# Patient Record
Sex: Female | Born: 1990 | Race: White | Hispanic: No | Marital: Single | Smoking: Current every day smoker
Health system: Southern US, Community
[De-identification: ages and names within clinical notes are randomized; demographics above are authoritative.]

## PROBLEM LIST (undated history)

## (undated) DIAGNOSIS — F329 Major depressive disorder, single episode, unspecified: Secondary | ICD-10-CM

## (undated) DIAGNOSIS — F32A Depression, unspecified: Secondary | ICD-10-CM

## (undated) DIAGNOSIS — F419 Anxiety disorder, unspecified: Secondary | ICD-10-CM

## (undated) DIAGNOSIS — T7840XA Allergy, unspecified, initial encounter: Secondary | ICD-10-CM

## (undated) DIAGNOSIS — B009 Herpesviral infection, unspecified: Secondary | ICD-10-CM

## (undated) DIAGNOSIS — M549 Dorsalgia, unspecified: Secondary | ICD-10-CM

## (undated) DIAGNOSIS — F319 Bipolar disorder, unspecified: Secondary | ICD-10-CM

## (undated) HISTORY — DX: Allergy, unspecified, initial encounter: T78.40XA

## (undated) HISTORY — DX: Major depressive disorder, single episode, unspecified: F32.9

## (undated) HISTORY — DX: Depression, unspecified: F32.A

## (undated) HISTORY — PX: TONSILLECTOMY: SUR1361

---

## 2013-04-16 LAB — HM PAP SMEAR: HM Pap smear: NORMAL

## 2013-04-16 LAB — LIPID PANEL
CHOLESTEROL: 177 mg/dL (ref 0–200)
HDL: 53 mg/dL (ref 35–70)
LDL CALC: 94 mg/dL
TRIGLYCERIDES: 150 mg/dL (ref 40–160)

## 2013-12-16 ENCOUNTER — Ambulatory Visit: Payer: Self-pay | Admitting: Family Medicine

## 2014-06-12 DIAGNOSIS — F419 Anxiety disorder, unspecified: Secondary | ICD-10-CM | POA: Insufficient documentation

## 2014-06-12 DIAGNOSIS — G8921 Chronic pain due to trauma: Secondary | ICD-10-CM | POA: Insufficient documentation

## 2014-11-13 LAB — CBC AND DIFFERENTIAL: Hemoglobin: 13 g/dL (ref 12.0–16.0)

## 2014-11-18 ENCOUNTER — Ambulatory Visit: Payer: Self-pay | Admitting: Internal Medicine

## 2015-01-20 ENCOUNTER — Ambulatory Visit: Payer: Self-pay | Admitting: Internal Medicine

## 2015-10-07 ENCOUNTER — Encounter: Payer: Self-pay | Admitting: Internal Medicine

## 2015-10-07 ENCOUNTER — Other Ambulatory Visit: Payer: Self-pay | Admitting: Internal Medicine

## 2015-10-07 DIAGNOSIS — R32 Unspecified urinary incontinence: Secondary | ICD-10-CM | POA: Insufficient documentation

## 2015-10-07 DIAGNOSIS — F324 Major depressive disorder, single episode, in partial remission: Secondary | ICD-10-CM | POA: Insufficient documentation

## 2015-10-07 DIAGNOSIS — Z8742 Personal history of other diseases of the female genital tract: Secondary | ICD-10-CM | POA: Insufficient documentation

## 2015-10-07 DIAGNOSIS — K120 Recurrent oral aphthae: Secondary | ICD-10-CM | POA: Insufficient documentation

## 2016-09-11 DIAGNOSIS — F19950 Other psychoactive substance use, unspecified with psychoactive substance-induced psychotic disorder with delusions: Secondary | ICD-10-CM | POA: Insufficient documentation

## 2016-09-12 DIAGNOSIS — F1120 Opioid dependence, uncomplicated: Secondary | ICD-10-CM | POA: Insufficient documentation

## 2016-09-12 DIAGNOSIS — F1420 Cocaine dependence, uncomplicated: Secondary | ICD-10-CM | POA: Insufficient documentation

## 2016-09-12 DIAGNOSIS — Z8659 Personal history of other mental and behavioral disorders: Secondary | ICD-10-CM | POA: Insufficient documentation

## 2016-09-12 DIAGNOSIS — F1220 Cannabis dependence, uncomplicated: Secondary | ICD-10-CM | POA: Insufficient documentation

## 2016-09-21 DIAGNOSIS — F1520 Other stimulant dependence, uncomplicated: Secondary | ICD-10-CM | POA: Insufficient documentation

## 2016-09-25 ENCOUNTER — Ambulatory Visit (INDEPENDENT_AMBULATORY_CARE_PROVIDER_SITE_OTHER): Payer: Self-pay

## 2016-09-25 ENCOUNTER — Encounter: Payer: Self-pay | Admitting: Internal Medicine

## 2016-09-25 VITALS — BP 148/86 | HR 88 | Resp 16 | Ht 67.0 in | Wt 228.4 lb

## 2016-09-25 DIAGNOSIS — S32009A Unspecified fracture of unspecified lumbar vertebra, initial encounter for closed fracture: Secondary | ICD-10-CM | POA: Insufficient documentation

## 2016-09-25 DIAGNOSIS — M62830 Muscle spasm of back: Secondary | ICD-10-CM

## 2016-09-25 DIAGNOSIS — S20219A Contusion of unspecified front wall of thorax, initial encounter: Secondary | ICD-10-CM

## 2016-09-25 MED ORDER — CYCLOBENZAPRINE HCL 10 MG PO TABS: 10.0000 mg | ORAL_TABLET | Freq: Three times a day (TID) | ORAL | 0 refills | Status: DC | PRN

## 2016-09-25 MED ORDER — IBUPROFEN 800 MG PO TABS: 800.0000 mg | ORAL_TABLET | Freq: Three times a day (TID) | ORAL | 0 refills | Status: DC | PRN
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
## 2016-09-25 NOTE — Patient Instructions (Addendum)
Ibuprofen 2400 mg/day - maximum 12 of the 200 mg per day ----800 mg three times a day  Acetaminophen 4000 mg/day maximum -----1000 mg 4 times a day  Heat 20 minutes three times a day  Stretch after the heat

## 2016-10-24 ENCOUNTER — Other Ambulatory Visit: Payer: Self-pay | Admitting: Internal Medicine

## 2016-10-24 DIAGNOSIS — S20219A Contusion of unspecified front wall of thorax, initial encounter: Secondary | ICD-10-CM

## 2016-10-24 DIAGNOSIS — M62830 Muscle spasm of back: Secondary | ICD-10-CM

## 2016-10-24 MED ORDER — IBUPROFEN 800 MG PO TABS: 800.0000 mg | ORAL_TABLET | Freq: Three times a day (TID) | ORAL | 2 refills | Status: DC | PRN

## 2016-10-24 MED ORDER — CYCLOBENZAPRINE HCL 10 MG PO TABS: 10.0000 mg | ORAL_TABLET | Freq: Three times a day (TID) | ORAL | 2 refills | Status: DC | PRN

## 2016-12-28 ENCOUNTER — Encounter: Payer: Self-pay | Admitting: *Deleted

## 2016-12-28 ENCOUNTER — Ambulatory Visit
Admission: EM | Admit: 2016-12-28 | Discharge: 2016-12-28 | Disposition: A | Discharge status: Home / Self Care | Payer: BLUE CROSS/BLUE SHIELD | Attending: Family Medicine | Admitting: Family Medicine

## 2016-12-28 DIAGNOSIS — T148XXA Other injury of unspecified body region, initial encounter: Secondary | ICD-10-CM | POA: Diagnosis not present

## 2016-12-28 DIAGNOSIS — L089 Local infection of the skin and subcutaneous tissue, unspecified: Secondary | ICD-10-CM

## 2016-12-28 DIAGNOSIS — L03116 Cellulitis of left lower limb: Secondary | ICD-10-CM | POA: Diagnosis not present

## 2016-12-28 HISTORY — DX: Anxiety disorder, unspecified: F41.9

## 2016-12-28 HISTORY — DX: Bipolar disorder, unspecified: F31.9

## 2016-12-28 MED ORDER — CEFUROXIME AXETIL 500 MG PO TABS: 500.0000 mg | ORAL_TABLET | Freq: Two times a day (BID) | ORAL | 0 refills | Status: DC

## 2016-12-28 MED ORDER — MUPIROCIN 2 % EX OINT: 1.0000 "application " | TOPICAL_OINTMENT | Freq: Three times a day (TID) | CUTANEOUS | 0 refills | Status: DC

## 2016-12-28 MED ORDER — HYDROCODONE-ACETAMINOPHEN 5-325 MG PO TABS: 2.0000 | ORAL_TABLET | ORAL | 0 refills | Status: DC | PRN

## 2016-12-28 MED ORDER — MELOXICAM 15 MG PO TABS: 15.0000 mg | ORAL_TABLET | Freq: Every day | ORAL | 0 refills | Status: DC

## 2016-12-28 NOTE — ED Triage Notes (Signed)
Patient fell and lacerated her left thigh / hip. [facility]. The laceration is irritated and discoloration from bruising is present. Swelling is also visible. Patient reports cleaning the wound twice daily.

## 2016-12-28 NOTE — ED Provider Notes (Signed)
[facility]    CSN: [id number] Arrival date & time: 12/28/16  1551     History   Chief Complaint Chief Complaint  Patient presents with  . Wound Check    HPI Alyssa Allen is a 26 y.o. female.   Patient is 26 year old white female who states that she fell down 5 flights of stairs. She is a laceration to her left upper thigh. She reports getting sun at [facility] emergency room. She states no x-rays done of her leg since then she's had increasing pain in her left leg and drainage coming from the wound. She reports marked bruising of the left upper thigh as well on the lateral aspect. No nausea no vomiting but states that she's been on Bactroban ointment on the wound but has not been doing anything for the wound. Past smoking history no known drug allergies she has bipolar disease depression and anxiety. She's had a closed fracture of transverse lumbar vessel.. Patient requested something for pain and for the infection   The history is provided by the patient. No language interpreter was used.  Wound Check  This is a new problem. The current episode started more than 2 days ago. The problem occurs constantly. The problem has been gradually worsening. Pertinent negatives include no chest pain, no abdominal pain, no headaches and no shortness of breath. Nothing aggravates the symptoms. Nothing relieves the symptoms. She has tried nothing for the symptoms.    Past Medical History:  Diagnosis Date  . Allergy   . Anxiety   . Bipolar 1 disorder (HCC)   . Depression     Patient Active Problem List   Diagnosis Date Noted  . Closed fracture of transverse process of lumbar vertebra (HCC) 09/25/2016  . Adderall use disorder, severe (HCC) 09/21/2016  . Cannabis use disorder, severe, dependence (HCC) 09/12/2016  . Cocaine use disorder, severe, dependence (HCC) 09/12/2016  . History of ADHD 09/12/2016  . History of bipolar disorder 09/12/2016  . Opioid use disorder,  moderate, dependence (HCC) 09/12/2016  . 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 H/O abnormal cervical Papanicolaou smear 10/07/2015  . Incontinence 10/07/2015  . Major depressive disorder with single episode, in partial remission (HCC) 10/07/2015  . Aphthae 10/07/2015  . Anxiety 06/12/2014  . Chronic pain due to trauma 06/12/2014    Past Surgical History:  Procedure Laterality Date  . TONSILLECTOMY      OB History    No data available       Home Medications    Prior to Admission medications   Medication Sig Start Date End Date Taking? Authorizing Provider  benztropine (COGENTIN) 0.5 MG tablet Take 0.5 mg by mouth 3 (three) times daily.   Yes Historical Provider, MD  cyclobenzaprine (FLEXERIL) 10 MG tablet Take 1 tablet (10 mg total) by mouth 3 (three) times daily as needed for muscle spasms. 10/24/16  Yes Reubin Milan, MD  divalproex (DEPAKOTE) 500 MG DR tablet Take 500 mg by mouth 3 (three) times daily.   Yes Historical Provider, MD  haloperidol (HALDOL) 5 MG tablet Take 5 mg by mouth 2 (two) times daily.   Yes Historical Provider, MD  hydrOXYzine (ATARAX/VISTARIL) 25 MG tablet Take 25 mg by mouth 3 (three) times daily as needed.   Yes Historical Provider, MD  ibuprofen (ADVIL,MOTRIN) 800 MG tablet Take 1 tablet (800 mg total) by mouth every 8 (eight) hours as needed. 10/24/16  Yes Reubin Milan, MD  imipramine (TOFRANIL) 50  MG tablet Take 50 mg by mouth at bedtime.   Yes Historical Provider, MD  naltrexone (DEPADE) 50 MG tablet Take by mouth daily.   Yes Historical Provider, MD  norethindrone-ethinyl estradiol (CYCLAFEM,ALYACEN) 0.5/0.75/1-35 MG-MCG tablet Take by mouth.   Yes Historical Provider, MD  ziprasidone (GEODON) 60 MG capsule Take 2 capsules by mouth at bedtime.   Yes Historical Provider, MD  cefUROXime (CEFTIN) 500 MG tablet Take 1 tablet (500 mg total) by mouth 2 (two) times daily. 12/28/16   Hassan Rowan, MD    HYDROcodone-acetaminophen (NORCO/VICODIN) 5-325 MG tablet Take 2 tablets by mouth every 4 (four) hours as needed. 12/28/16   Hassan Rowan, MD  meloxicam (MOBIC) 15 MG tablet Take 1 tablet (15 mg total) by mouth daily. 12/28/16   Hassan Rowan, MD  mupirocin ointment (BACTROBAN) 2 % Apply 1 application topically 3 (three) times daily. 12/28/16   Hassan Rowan, MD    Family History Family History  Problem Relation Age of Onset  . Hyperlipidemia Father   . CAD Father   . Cancer Mother     Social History Social History  Substance Use Topics  . Smoking status: Current Every Day Smoker    Packs/day: 1.00    Types: Cigarettes  . Smokeless tobacco: Never Used  . Alcohol use No     Allergies   Patient has no known allergies.   Review of Systems Review of Systems  Respiratory: Negative for shortness of breath.   Cardiovascular: Negative for chest pain.  Gastrointestinal: Negative for abdominal pain.  Musculoskeletal: Positive for gait problem and myalgias.  Skin: Positive for wound.  Neurological: Negative for headaches.  All other systems reviewed and are negative.    Physical Exam Triage Vital Signs ED Triage Vitals  Enc Vitals Group     BP 12/28/16 1848 122/72     Pulse Rate 12/28/16 1848 (!) 104     Resp 12/28/16 1848 16     Temp 12/28/16 1848 98.9 F (37.2 C)     Temp Source 12/28/16 1848 Oral     SpO2 12/28/16 1848 100 %     Weight 12/28/16 1849 240 lb (108.9 kg)     Height 12/28/16 1849 5\' 7"  (1.702 m)     Head Circumference --      Peak Flow --      Pain Score 12/28/16 1907 7     Pain Loc --      Pain Edu? --      Excl. in GC? --    No data found.   Updated Vital Signs BP 122/72 (BP Location: Left Arm)   Pulse (!) 104   Temp 98.9 F (37.2 C) (Oral)   Resp 16   Ht 5\' 7"  (1.702 m)   Wt 240 lb (108.9 kg)   LMP 11/27/2016   SpO2 100%   BMI 37.59 kg/m   Visual Acuity Right Eye Distance:   Left Eye Distance:   Bilateral Distance:    Right Eye Near:    Left Eye Near:    Bilateral Near:     Physical Exam  Constitutional: She is oriented to person, place, and time. She appears well-developed and well-nourished.  HENT:  Head: Normocephalic and atraumatic.  Eyes: EOM are normal. Pupils are equal, round, and reactive to light.  Neck: Normal range of motion.  Pulmonary/Chest: Effort normal.  Musculoskeletal: Normal range of motion. She exhibits tenderness.  Neurological: She is alert and oriented to person, place, and time. No cranial nerve  deficit.  Skin: Skin is warm. Rash noted. There is erythema.  Psychiatric: She has a normal mood and affect. Her behavior is normal.  Vitals reviewed.    UC Treatments / Results  Labs (all labs ordered are listed, but only abnormal results are displayed) Labs Reviewed - No data to display  EKG  EKG Interpretation None       Radiology No results found.  Procedures Procedures (including critical care time)  Medications Ordered in UC Medications - No data to display   Initial Impression / Assessment and Plan / UC Course  I have reviewed the triage vital signs and the nursing notes.  Pertinent labs & imaging results that were available during my care of the patient were reviewed by me and considered in my medical decision making (see chart for details).    Patient states that [facility] or her left femur. She reports sitting give her anything for pain she is having significant amount of pain with the infection. Discussed with her about pain medication and she informs that she's not had any pain prescriptions in quite a while. [facility] reporting [facility] and that and corroborated her story and all of that was seen was prescriptions for Adderall and for lorazepam. However when chart was being done and was found the patient has a history of polysubstance abuse opiate dependency and psychotic behavior with substance abuse. The Vicodin prescription was  seized and destroyed and patient will just use Mobic for pain. She states she is on a opiate blocker but still will not give her any narcotics at this time.  Final Clinical Impressions(s) / UC Diagnoses   Final diagnoses:  Wound infection  Cellulitis of left thigh    Note: This dictation was prepared with Dragon dictation along with smaller phrase technology. Any transcriptional errors that result from this process are unintentional. New Prescriptions New Prescriptions   CEFUROXIME (CEFTIN) 500 MG TABLET    Take 1 tablet (500 mg total) by mouth 2 (two) times daily.   HYDROCODONE-ACETAMINOPHEN (NORCO/VICODIN) 5-325 MG TABLET    Take 2 tablets by mouth every 4 (four) hours as needed.   MELOXICAM (MOBIC) 15 MG TABLET    Take 1 tablet (15 mg total) by mouth daily.   MUPIROCIN OINTMENT (BACTROBAN) 2 %    Apply 1 application topically 3 (three) times daily.     Hassan RowanEugene Merlene Dante, MD 12/28/16 2008

## 2017-01-01 ENCOUNTER — Encounter: Payer: Self-pay | Admitting: Emergency Medicine

## 2017-01-01 ENCOUNTER — Ambulatory Visit
Admission: EM | Admit: 2017-01-01 | Discharge: 2017-01-01 | Disposition: A | Discharge status: Home / Self Care | Payer: BLUE CROSS/BLUE SHIELD | Attending: Family Medicine | Admitting: Family Medicine

## 2017-01-01 ENCOUNTER — Ambulatory Visit (INDEPENDENT_AMBULATORY_CARE_PROVIDER_SITE_OTHER): Payer: BLUE CROSS/BLUE SHIELD

## 2017-01-01 DIAGNOSIS — M79605 Pain in left leg: Secondary | ICD-10-CM | POA: Diagnosis not present

## 2017-01-01 DIAGNOSIS — Z5189 Encounter for other specified aftercare: Secondary | ICD-10-CM

## 2017-01-01 LAB — PREGNANCY, URINE: PREG TEST UR: NEGATIVE

## 2017-01-01 NOTE — ED Triage Notes (Signed)
Patient c/o pain, swelling and redness at her incision site that has gotten worse.  Patient is currently on an antibiotic.

## 2017-01-01 NOTE — ED Provider Notes (Signed)
[facility]    CSN: [id number] Arrival date & time: 01/01/17  1613     History   Chief Complaint Chief Complaint  Patient presents with  . Wound Check    HPI Alyssa Allen is a 26 y.o. female.   Patient is a 26 year old white female who fell down some steps. About a week ago. She was seen at [facility] sutured but since then she was seen here last week Thursday because of a wound infection and drainage coming from the wound. She was placed on anabiotic by mouth antibiotic ointment. She had marked bruising of the left thigh. At that time we discussed x-raying the left thigh but because of the eighth stating her stay here she opted not to have that done. She's been complaining of pain we opted not to put any pain medication since his documented in her records that she's had a history of opiate trouble and LPO abuse. Placed on Mobic for pain. Since Thursday she's had still present aggressive pain in her left leg and she became concerned because a mild drainage coming from the left leg. She describes a very large amount drainage coming from the wound. Patient is a smoker. She's had a history tonsillectomy. Muscular medical problems involved substance abuse ADHD depression and other mental illness like bipolar disease please see earlier chart   The history is provided by the patient. No language interpreter was used.  Wound Check  This is a recurrent problem. The current episode started more than 1 week ago. The problem occurs constantly. The problem has not changed since onset.Pertinent negatives include no chest pain, no abdominal pain and no headaches. Nothing aggravates the symptoms. Nothing relieves the symptoms. She has tried nothing for the symptoms.    Past Medical History:  Diagnosis Date  . Allergy   . Anxiety   . Bipolar 1 disorder (HCC)   . Depression     Patient Active Problem List   Diagnosis Date Noted  . Closed fracture of transverse process of lumbar  vertebra (HCC) 09/25/2016  . Adderall use disorder, severe (HCC) 09/21/2016  . Cannabis use disorder, severe, dependence (HCC) 09/12/2016  . Cocaine use disorder, severe, dependence (HCC) 09/12/2016  . History of ADHD 09/12/2016  . History of bipolar disorder 09/12/2016  . Opioid use disorder, moderate, dependence (HCC) 09/12/2016  . 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 H/O abnormal cervical Papanicolaou smear 10/07/2015  . Incontinence 10/07/2015  . Major depressive disorder with single episode, in partial remission (HCC) 10/07/2015  . Aphthae 10/07/2015  . Anxiety 06/12/2014  . Chronic pain due to trauma 06/12/2014    Past Surgical History:  Procedure Laterality Date  . TONSILLECTOMY      OB History    No data available       Home Medications    Prior to Admission medications   Medication Sig Start Date End Date Taking? Authorizing Provider  benztropine (COGENTIN) 0.5 MG tablet Take 0.5 mg by mouth 3 (three) times daily.    Historical Provider, MD  cefUROXime (CEFTIN) 500 MG tablet Take 1 tablet (500 mg total) by mouth 2 (two) times daily. 12/28/16   Hassan Rowan, MD  cyclobenzaprine (FLEXERIL) 10 MG tablet Take 1 tablet (10 mg total) by mouth 3 (three) times daily as needed for muscle spasms. 10/24/16   Reubin Milan, MD  divalproex (DEPAKOTE) 500 MG DR tablet Take 500 mg by mouth 3 (three) times daily.    Historical Provider, MD  haloperidol (HALDOL) 5 MG tablet Take 5 mg by mouth 2 (two) times daily.    Historical Provider, MD  hydrOXYzine (ATARAX/VISTARIL) 25 MG tablet Take 25 mg by mouth 3 (three) times daily as needed.    Historical Provider, MD  ibuprofen (ADVIL,MOTRIN) 800 MG tablet Take 1 tablet (800 mg total) by mouth every 8 (eight) hours as needed. 10/24/16   Reubin MilanLaura H Berglund, MD  imipramine (TOFRANIL) 50 MG tablet Take 50 mg by mouth at bedtime.    Historical Provider, MD  meloxicam (MOBIC) 15 MG tablet Take 1 tablet (15 mg  total) by mouth daily. 12/28/16   Hassan RowanEugene Byran Bilotti, MD  mupirocin ointment (BACTROBAN) 2 % Apply 1 application topically 3 (three) times daily. 12/28/16   Hassan RowanEugene Briya Lookabaugh, MD  naltrexone (DEPADE) 50 MG tablet Take by mouth daily.    Historical Provider, MD  norethindrone-ethinyl estradiol (CYCLAFEM,ALYACEN) 0.5/0.75/1-35 MG-MCG tablet Take by mouth.    Historical Provider, MD  ziprasidone (GEODON) 60 MG capsule Take 2 capsules by mouth at bedtime.    Historical Provider, MD    Family History Family History  Problem Relation Age of Onset  . Hyperlipidemia Father   . CAD Father   . Cancer Mother     Social History Social History  Substance Use Topics  . Smoking status: Current Every Day Smoker    Packs/day: 1.00    Types: Cigarettes  . Smokeless tobacco: Never Used  . Alcohol use No     Allergies   Patient has no known allergies.   Review of Systems Review of Systems  Cardiovascular: Negative for chest pain.  Gastrointestinal: Negative for abdominal pain.  Musculoskeletal: Positive for gait problem and myalgias.  Neurological: Negative for headaches.  All other systems reviewed and are negative.    Physical Exam Triage Vital Signs ED Triage Vitals  Enc Vitals Group     BP 01/01/17 1632 126/79     Pulse Rate 01/01/17 1632 (!) 120     Resp 01/01/17 1632 16     Temp 01/01/17 1632 99.1 F (37.3 C)     Temp Source 01/01/17 1632 Oral     SpO2 01/01/17 1632 98 %     Weight 01/01/17 1630 240 lb (108.9 kg)     Height 01/01/17 1630 5\' 7"  (1.702 m)     Head Circumference --      Peak Flow --      Pain Score 01/01/17 1631 7     Pain Loc --      Pain Edu? --      Excl. in GC? --    No data found.   Updated Vital Signs BP 126/79 (BP Location: Left Arm)   Pulse (!) 120   Temp 99.1 F (37.3 C) (Oral)   Resp 16   Ht 5\' 7"  (1.702 m)   Wt 240 lb (108.9 kg)   LMP 12/01/2016 (Approximate) Comment: pt denies preg but wanted test. hcg negative  SpO2 98%   BMI 37.59 kg/m    Visual Acuity Right Eye Distance:   Left Eye Distance:   Bilateral Distance:    Right Eye Near:   Left Eye Near:    Bilateral Near:     Physical Exam  Constitutional: She is oriented to person, place, and time. She appears well-developed and well-nourished.  HENT:  Head: Normocephalic and atraumatic.  Eyes: Pupils are equal, round, and reactive to light.  Neck: Normal range of motion.  Musculoskeletal: She exhibits edema and tenderness.  Left upper leg: She exhibits tenderness and swelling.       Legs: Concern expressed about the wound on the left upper thigh. The wound itself though looks much better than he did Thursday with decreased redness and irritation around. The most striking thing is that the bruising that was over the left thigh has improved greatly which is probably indicate that the drainage seen is probably old blood coming from that wound  Neurological: She is alert and oriented to person, place, and time.  Skin: Rash noted. There is erythema.  Psychiatric: She has a normal mood and affect.  Vitals reviewed.    UC Treatments / Results  Labs (all labs ordered are listed, but only abnormal results are displayed) Labs Reviewed  PREGNANCY, URINE    EKG  EKG Interpretation None       Radiology Dg Femur Min 2 Views Left  Result Date: 01/01/2017 CLINICAL DATA:  Recent fall downstairs , left anterior leg pain EXAM: LEFT FEMUR 2 VIEWS COMPARISON:  None available FINDINGS: Intact left femur. Normal alignment without fracture. No acute osseous abnormality evident. Left lateral thigh subcutaneous soft tissue prominence, suspect soft tissue hematoma. Visualized left hemipelvis and sacrum intact. IMPRESSION: No acute osseous finding Left proximal lateral thigh soft tissue bruising/hematoma Electronically Signed   By: Judie Petit.  Shick M.D.   On: 01/01/2017 18:02    Procedures Procedures (including critical care time)  Medications Ordered in UC Medications - No data  to display   Initial Impression / Assessment and Plan / UC Course  I have reviewed the triage vital signs and the nursing notes.  Pertinent labs & imaging results that were available during my care of the patient were reviewed by me and considered in my medical decision making (see chart for details).   patient still has concern since no x-rays were done on the left thigh and hip fracture be present because of mild pain and discomfort she is having. At this point time we'll x-ray the left hip just to make sure there is no fracture present but is stressed the patient asked the wound is improved and that this is probably old blood this oozing out of the wound and I would not do anything different but continue the antibiotic and ointment.    Final Clinical Impressions(s) / UC Diagnoses   Final diagnoses:  Leg pain, anterior, left  Visit for wound check    New Prescriptions New Prescriptions   No medications on file     Hassan Rowan, MD 01/01/17 1810

## 2017-01-10 ENCOUNTER — Encounter: Payer: Self-pay | Admitting: *Deleted

## 2017-01-10 ENCOUNTER — Ambulatory Visit
Admission: EM | Admit: 2017-01-10 | Discharge: 2017-01-10 | Disposition: A | Discharge status: Home / Self Care | Payer: BLUE CROSS/BLUE SHIELD | Attending: Family Medicine | Admitting: Family Medicine

## 2017-01-10 DIAGNOSIS — Z4802 Encounter for removal of sutures: Secondary | ICD-10-CM

## 2017-01-10 DIAGNOSIS — Z5189 Encounter for other specified aftercare: Secondary | ICD-10-CM

## 2017-01-10 MED ORDER — CEFUROXIME AXETIL 500 MG PO TABS: 500.0000 mg | ORAL_TABLET | Freq: Two times a day (BID) | ORAL | 0 refills | Status: DC

## 2017-01-10 NOTE — Discharge Instructions (Signed)
Continue current wound care

## 2017-01-10 NOTE — ED Triage Notes (Signed)
Suture removal. 5 suture removed. No redness, swelling or drainage present.

## 2017-01-18 ENCOUNTER — Other Ambulatory Visit: Payer: Self-pay | Admitting: Internal Medicine

## 2017-01-18 DIAGNOSIS — M62830 Muscle spasm of back: Secondary | ICD-10-CM

## 2017-01-18 DIAGNOSIS — S20219A Contusion of unspecified front wall of thorax, initial encounter: Secondary | ICD-10-CM

## 2017-01-19 ENCOUNTER — Other Ambulatory Visit: Payer: Self-pay | Admitting: Internal Medicine

## 2017-01-19 DIAGNOSIS — M62830 Muscle spasm of back: Secondary | ICD-10-CM

## 2017-01-19 DIAGNOSIS — S20219A Contusion of unspecified front wall of thorax, initial encounter: Secondary | ICD-10-CM

## 2017-01-22 ENCOUNTER — Other Ambulatory Visit: Payer: Self-pay | Admitting: Internal Medicine

## 2017-01-22 DIAGNOSIS — M62830 Muscle spasm of back: Secondary | ICD-10-CM

## 2017-01-22 DIAGNOSIS — S20219A Contusion of unspecified front wall of thorax, initial encounter: Secondary | ICD-10-CM

## 2017-01-22 NOTE — Telephone Encounter (Signed)
Pt called requesting refills on 800 mg Ibuprofen & 10 mg Cyclobenzaprine. Pt was last seen on 09/25/2016.

## 2017-01-23 ENCOUNTER — Telehealth: Payer: Self-pay

## 2017-01-23 ENCOUNTER — Other Ambulatory Visit: Payer: Self-pay | Admitting: Internal Medicine

## 2017-01-23 DIAGNOSIS — M62830 Muscle spasm of back: Secondary | ICD-10-CM

## 2017-01-23 DIAGNOSIS — S20219A Contusion of unspecified front wall of thorax, initial encounter: Secondary | ICD-10-CM

## 2017-01-23 MED ORDER — IBUPROFEN 800 MG PO TABS: 800.0000 mg | ORAL_TABLET | Freq: Three times a day (TID) | ORAL | 2 refills | Status: DC | PRN

## 2017-01-23 MED ORDER — CYCLOBENZAPRINE HCL 10 MG PO TABS: 10.0000 mg | ORAL_TABLET | Freq: Three times a day (TID) | ORAL | 2 refills | Status: DC | PRN

## 2017-01-23 NOTE — Telephone Encounter (Signed)
Pt requesting refill on Ibuprofen and Cyclobenzaprine. [place] [facility] [place]

## 2017-01-23 NOTE — Telephone Encounter (Signed)
Done

## 2017-04-02 ENCOUNTER — Other Ambulatory Visit: Payer: Self-pay | Admitting: Internal Medicine

## 2017-04-02 DIAGNOSIS — S20219A Contusion of unspecified front wall of thorax, initial encounter: Secondary | ICD-10-CM

## 2017-04-02 DIAGNOSIS — M62830 Muscle spasm of back: Secondary | ICD-10-CM

## 2017-04-11 NOTE — ED Provider Notes (Addendum)
[facility]    CSN: [id number] Arrival date & time: 01/10/17  1907     History   Chief Complaint Chief Complaint  Patient presents with  . Suture / Staple Removal    HPI Alyssa Allen is a 26 y.o. female.   26 yo female here for wound check and suture removal. States doing well. No new complaints. Denies any pain, redness or drainage.    The history is provided by the patient.  Suture / Staple Removal     Past Medical History:  Diagnosis Date  . Allergy   . Anxiety   . Bipolar 1 disorder (HCC)   . Depression     Patient Active Problem List   Diagnosis Date Noted  . Closed fracture of transverse process of lumbar vertebra (HCC) 09/25/2016  . Adderall use disorder, severe (HCC) 09/21/2016  . Cannabis use disorder, severe, dependence (HCC) 09/12/2016  . Cocaine use disorder, severe, dependence (HCC) 09/12/2016  . History of ADHD 09/12/2016  . History of bipolar disorder 09/12/2016  . Opioid use disorder, moderate, dependence (HCC) 09/12/2016  . 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 H/O abnormal cervical Papanicolaou smear 10/07/2015  . Incontinence 10/07/2015  . Major depressive disorder with single episode, in partial remission (HCC) 10/07/2015  . Aphthae 10/07/2015  . Anxiety 06/12/2014  . Chronic pain due to trauma 06/12/2014    Past Surgical History:  Procedure Laterality Date  . TONSILLECTOMY      OB History    No data available       Home Medications    Prior to Admission medications   Medication Sig Start Date End Date Taking? Authorizing Provider  OLANZapine (ZYPREXA) 15 MG tablet Take 15 mg by mouth at bedtime.   Yes [provider]  benztropine (COGENTIN) 0.5 MG tablet Take 0.5 mg by mouth 3 (three) times daily.    [provider]  cefUROXime (CEFTIN) 500 MG tablet Take 1 tablet (500 mg total) by mouth 2 (two) times daily with a meal. 01/10/17   Payton Mccallum, MD    cyclobenzaprine (FLEXERIL) 10 MG tablet TAKE 1 TABLET BY MOUTH THREE TIMES DAILY AS NEEDED FOR MUSCLE SPASM 04/02/17   Reubin Milan, MD  divalproex (DEPAKOTE) 500 MG DR tablet Take 500 mg by mouth 3 (three) times daily.    [provider]  haloperidol (HALDOL) 5 MG tablet Take 5 mg by mouth 2 (two) times daily.    [provider]  hydrOXYzine (ATARAX/VISTARIL) 25 MG tablet Take 25 mg by mouth 3 (three) times daily as needed.    [provider]  ibuprofen (ADVIL,MOTRIN) 800 MG tablet TAKE 1 TABLET BY MOUTH EVERY 8 HOURS AS NEEDED 04/02/17   Reubin Milan, MD  imipramine (TOFRANIL) 50 MG tablet Take 50 mg by mouth at bedtime.    [provider]  meloxicam (MOBIC) 15 MG tablet Take 1 tablet (15 mg total) by mouth daily. 12/28/16   Hassan Rowan, MD  mupirocin ointment (BACTROBAN) 2 % Apply 1 application topically 3 (three) times daily. 12/28/16   Hassan Rowan, MD  naltrexone (DEPADE) 50 MG tablet Take by mouth daily.    [provider]  norethindrone-ethinyl estradiol (CYCLAFEM,ALYACEN) 0.5/0.75/1-35 MG-MCG tablet Take by mouth.    [provider]  ziprasidone (GEODON) 60 MG capsule Take 2 capsules by mouth at bedtime.    [provider]    Family History Family History  Problem Relation Age of Onset  .  Hyperlipidemia Father   . CAD Father   . Cancer Mother     Social History Social History  Substance Use Topics  . Smoking status: Current Every Day Smoker    Packs/day: 1.00    Types: Cigarettes  . Smokeless tobacco: Never Used  . Alcohol use No     Allergies   Patient has no known allergies.   Review of Systems Review of Systems   Physical Exam Triage Vital Signs ED Triage Vitals  Enc Vitals Group     BP 01/10/17 1926 139/76     Pulse Rate 01/10/17 1926 (!) 112     Resp 01/10/17 1926 18     Temp 01/10/17 1926 98.1 F (36.7 C)     Temp src --      SpO2 01/10/17 1926 99 %     Weight --      Height --       Head Circumference --      Peak Flow --      Pain Score 01/10/17 1929 0     Pain Loc --      Pain Edu? --      Excl. in GC? --    No data found.   Updated Vital Signs BP 139/76 (BP Location: Left Arm)   Pulse (!) 112   Temp 98.1 F (36.7 C)   Resp 18   LMP 01/04/2017   SpO2 99%   Visual Acuity Right Eye Distance:   Left Eye Distance:   Bilateral Distance:    Right Eye Near:   Left Eye Near:    Bilateral Near:     Physical Exam  Constitutional: She appears well-developed and well-nourished. No distress.  Skin: She is not diaphoretic.  Well healed wound on left thigh; no redness, no drainage; no tenderness  Nursing note and vitals reviewed.    UC Treatments / Results  Labs (all labs ordered are listed, but only abnormal results are displayed) Labs Reviewed - No data to display  EKG  EKG Interpretation None       Radiology No results found.  Procedures Procedures (including critical care time)  Medications Ordered in UC Medications - No data to display   Initial Impression / Assessment and Plan / UC Course  I have reviewed the triage vital signs and the nursing notes.  Pertinent labs & imaging results that were available during my care of the patient were reviewed by me and considered in my medical decision making (see chart for details).      Final Clinical Impressions(s) / UC Diagnoses   Final diagnoses:  Visit for wound check  Visit for suture removal    New Prescriptions Discharge Medication List as of 01/10/2017  8:14 PM     1.  diagnosis reviewed with patient 2. Follow-up prn if symptoms worsen or don't improve   Payton Mccallumonty, Sarissa Dern, MD 04/11/17 2112    Payton Mccallumonty, Ahjanae Cassel, MD 04/11/17 2113

## 2017-06-04 ENCOUNTER — Ambulatory Visit
Admission: EM | Admit: 2017-06-04 | Discharge: 2017-06-04 | Disposition: A | Discharge status: Home / Self Care | Payer: BLUE CROSS/BLUE SHIELD | Attending: Family Medicine | Admitting: Family Medicine

## 2017-06-04 ENCOUNTER — Encounter: Payer: Self-pay | Admitting: *Deleted

## 2017-06-04 ENCOUNTER — Other Ambulatory Visit: Payer: Self-pay | Admitting: Internal Medicine

## 2017-06-04 DIAGNOSIS — M722 Plantar fascial fibromatosis: Secondary | ICD-10-CM

## 2017-06-04 DIAGNOSIS — K120 Recurrent oral aphthae: Secondary | ICD-10-CM | POA: Diagnosis not present

## 2017-06-04 DIAGNOSIS — M62830 Muscle spasm of back: Secondary | ICD-10-CM

## 2017-06-04 DIAGNOSIS — S20219A Contusion of unspecified front wall of thorax, initial encounter: Secondary | ICD-10-CM

## 2017-06-04 NOTE — ED Triage Notes (Signed)
Rash or "welts" to mouth, vagina. Also, pt recently started new job requiring her to be on her feet on concrete floor all day. C/o right ankle pain , left heel pain and states left foot and heel go numb at times. Also c/o mild pedal edema. Denies injury.

## 2017-06-19 NOTE — ED Provider Notes (Signed)
[facility]    CSN: [id number] Arrival date & time: 06/04/17  1400     History   Chief Complaint Chief Complaint  Patient presents with  . Rash    HPI Alyssa Allen is a 26 y.o. female.   26 yo female with a c/o "painful sores in my mouth", left heel pain for several weeks and c/o "some small bumps in my vagina". Denies vaginal pain, abdominal pain, vaginal bleeding, abnormal vaginal discharge.     The history is provided by the patient.  Rash    Past Medical History:  Diagnosis Date  . Allergy   . Anxiety   . Bipolar 1 disorder (HCC)   . Depression     Patient Active Problem List   Diagnosis Date Noted  . Closed fracture of transverse process of lumbar vertebra (HCC) 09/25/2016  . Adderall use disorder, severe (HCC) 09/21/2016  . Cannabis use disorder, severe, dependence (HCC) 09/12/2016  . Cocaine use disorder, severe, dependence (HCC) 09/12/2016  . History of ADHD 09/12/2016  . History of bipolar disorder 09/12/2016  . Opioid use disorder, moderate, dependence (HCC) 09/12/2016  . 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 H/O abnormal cervical Papanicolaou smear 10/07/2015  . Incontinence 10/07/2015  . Major depressive disorder with single episode, in partial remission (HCC) 10/07/2015  . Aphthae 10/07/2015  . Anxiety 06/12/2014  . Chronic pain due to trauma 06/12/2014    Past Surgical History:  Procedure Laterality Date  . TONSILLECTOMY      OB History    No data available       Home Medications    Prior to Admission medications   Medication Sig Start Date End Date Taking? Authorizing Provider  divalproex (DEPAKOTE) 500 MG DR tablet Take 500 mg by mouth 3 (three) times daily.   Yes [provider]  norethindrone-ethinyl estradiol (CYCLAFEM,ALYACEN) 0.5/0.75/1-35 MG-MCG tablet Take by mouth.   Yes [provider]  OLANZapine (ZYPREXA) 15 MG tablet Take 15 mg by mouth at bedtime.    Yes [provider]  ziprasidone (GEODON) 60 MG capsule Take 2 capsules by mouth at bedtime.   Yes [provider]  benztropine (COGENTIN) 0.5 MG tablet Take 0.5 mg by mouth 3 (three) times daily.    [provider]  cefUROXime (CEFTIN) 500 MG tablet Take 1 tablet (500 mg total) by mouth 2 (two) times daily with a meal. 01/10/17   Payton Mccallum, MD  cyclobenzaprine (FLEXERIL) 10 MG tablet TAKE 1 TABLET BY MOUTH THREE TIMES DAILY AS NEEDED FOR MUSCLE SPASM 06/04/17   Reubin Milan, MD  haloperidol (HALDOL) 5 MG tablet Take 5 mg by mouth 2 (two) times daily.    [provider]  hydrOXYzine (ATARAX/VISTARIL) 25 MG tablet Take 25 mg by mouth 3 (three) times daily as needed.    [provider]  ibuprofen (ADVIL,MOTRIN) 800 MG tablet TAKE 1 TABLET BY MOUTH EVERY 8 HOURS AS NEEDED 06/04/17   Reubin Milan, MD  imipramine (TOFRANIL) 50 MG tablet Take 50 mg by mouth at bedtime.    [provider]  meloxicam (MOBIC) 15 MG tablet Take 1 tablet (15 mg total) by mouth daily. 12/28/16   Hassan Rowan, MD  mupirocin ointment (BACTROBAN) 2 % Apply 1 application topically 3 (three) times daily. 12/28/16   Hassan Rowan, MD  naltrexone (DEPADE) 50 MG tablet Take by mouth daily.    [provider]    Family History Family History  Problem  Relation Age of Onset  . Hyperlipidemia Father   . CAD Father   . Cancer Mother     Social History Social History  Substance Use Topics  . Smoking status: Current Every Day Smoker    Packs/day: 1.00    Types: Cigarettes  . Smokeless tobacco: Never Used  . Alcohol use No     Allergies   Patient has no known allergies.   Review of Systems Review of Systems  Skin: Positive for rash.     Physical Exam Triage Vital Signs ED Triage Vitals  Enc Vitals Group     BP 06/04/17 1455 (!) 140/93     Pulse Rate 06/04/17 1455 (!) 111     Resp 06/04/17 1455 16     Temp 06/04/17 1455 98 F (36.7 C)      Temp Source 06/04/17 1455 Oral     SpO2 06/04/17 1455 99 %     Weight 06/04/17 1456 300 lb (136.1 kg)     Height 06/04/17 1456 5\' 7"  (1.702 m)     Head Circumference --      Peak Flow --      Pain Score --      Pain Loc --      Pain Edu? --      Excl. in GC? --    No data found.   Updated Vital Signs BP (!) 140/93 (BP Location: Left Arm)   Pulse (!) 111   Temp 98 F (36.7 C) (Oral)   Resp 16   Ht 5\' 7"  (1.702 m)   Wt 300 lb (136.1 kg)   SpO2 99%   BMI 46.99 kg/m   Visual Acuity Right Eye Distance:   Left Eye Distance:   Bilateral Distance:    Right Eye Near:   Left Eye Near:    Bilateral Near:     Physical Exam  Constitutional: She appears well-developed and well-nourished. No distress.  HENT:  Head: Normocephalic.  Nose: Nose normal.  Mouth/Throat: Oral lesions (aphthous ulcers noted ) present. No oropharyngeal exudate, posterior oropharyngeal edema, posterior oropharyngeal erythema or tonsillar abscesses. No tonsillar exudate.  Genitourinary: Vagina normal. Pelvic exam was performed with patient supine. There is no rash, tenderness, lesion or injury on the right labia. There is no rash, tenderness, lesion or injury on the left labia.  Genitourinary Comments: Normal vaginal tissue noted; (CMA Melissa Hadley present as chaperone during examination)  Musculoskeletal:       Left foot: There is tenderness (over the left heel; no edema, erythema, lesions or injuries noted). There is normal range of motion, no bony tenderness, no swelling, normal capillary refill, no crepitus, no deformity and no laceration.  Skin: She is not diaphoretic.  Nursing note and vitals reviewed.    UC Treatments / Results  Labs (all labs ordered are listed, but only abnormal results are displayed) Labs Reviewed - No data to display  EKG  EKG Interpretation None       Radiology No results found.  Procedures Procedures (including critical care time)  Medications Ordered in  UC Medications - No data to display   Initial Impression / Assessment and Plan / UC Course  I have reviewed the triage vital signs and the nursing notes.  Pertinent labs & imaging results that were available during my care of the patient were reviewed by me and considered in my medical decision making (see chart for details).       Final Clinical Impressions(s) / UC Diagnoses  Final diagnoses:  Aphthous ulcer  Plantar fasciitis of left foot    New Prescriptions Discharge Medication List as of 06/04/2017  3:36 PM     1. diagnosis reviewed with patient 2. Recommend supportive treatment with otc analgesics, stretches and supportive footwear for plantar fasciitis; otc mouthwash for aphthous ulcers; 3. Reassured patient of normal genital exam (no abnormalities noted) 4. Follow-up prn if symptoms worsen or don't improve   Payton Mccallum, MD 06/19/17 [phone]

## 2017-07-11 ENCOUNTER — Other Ambulatory Visit: Payer: Self-pay | Admitting: Internal Medicine

## 2017-07-11 DIAGNOSIS — S20219A Contusion of unspecified front wall of thorax, initial encounter: Secondary | ICD-10-CM

## 2017-07-11 DIAGNOSIS — M62830 Muscle spasm of back: Secondary | ICD-10-CM

## 2017-07-14 ENCOUNTER — Ambulatory Visit (INDEPENDENT_AMBULATORY_CARE_PROVIDER_SITE_OTHER): Payer: BLUE CROSS/BLUE SHIELD

## 2017-07-14 ENCOUNTER — Ambulatory Visit
Admission: EM | Admit: 2017-07-14 | Discharge: 2017-07-14 | Disposition: A | Discharge status: Home / Self Care | Payer: BLUE CROSS/BLUE SHIELD | Attending: Internal Medicine | Admitting: Internal Medicine

## 2017-07-14 DIAGNOSIS — N898 Other specified noninflammatory disorders of vagina: Secondary | ICD-10-CM | POA: Diagnosis not present

## 2017-07-14 DIAGNOSIS — F411 Generalized anxiety disorder: Secondary | ICD-10-CM

## 2017-07-14 DIAGNOSIS — K625 Hemorrhage of anus and rectum: Secondary | ICD-10-CM

## 2017-07-14 LAB — OCCULT BLOOD X 1 CARD TO LAB, STOOL: Fecal Occult Bld: NEGATIVE

## 2017-07-14 NOTE — ED Triage Notes (Signed)
Pt here for rectal bleeding, nausea, numbness and tingling in her fingers. Said she thinks she has a foreign body in her abdomen and worms as well. No rectal itching or worms but did see black spots in her rectum. Said she is also having back pain. Pt is scared and crying. Also states her "bottom half is numb" and feeling "movements" This has been going on for a month and the rectal bleeding starting 1 week ago. Has been taking ibuprofen and chewing caffeine gum. Said she does think she has Hemoridds. Pt would like a xray to rule out any foreign bodies in her abdomen. .Marland Kitchen

## 2017-07-14 NOTE — ED Provider Notes (Signed)
[facility]    CSN: [id number] Arrival date & time: 07/14/17  1226     History   Chief Complaint Chief Complaint  Patient presents with  . Rectal Bleeding    HPI Alyssa Allen is a 26 y.o. female.   Patient is a 26 y.o. Female, with history of anxiety, depression and bipolar, on multiple medications (see below), presents today for multiple complaints.   She reports rectal bleeding for 1-2 weeks, this is a recurrent problem, in the past her rectal bleeding would resolve spontaneously, but bleedings gets worse each time it comes back. Denies blood in the toilet but is present on the tissue when she wiped. Current rectal bleeding is intermittent. It does not occur daily. It is painful during defecation, reports that she has to strain a lot during bowel movements. She is unsure of any hemorrhoids. She have noticed a lot of "movements" insider her abdomen and states that her gas has been more odorous than usual. She has a family history of colon cancer, which makes her more worry. She feels like something is moving inside her abdomen. She saw some "black speck" on her stool the other day that "might look like maggots". She is demanding for a xray.   She reports numbness and tingling sensation in her fingers and feet.   She reports to have felt two bead size lumps inside her vaginal canal, unsure how long they have been present but noticed it 2 months ago. She is not sexually active. No unusual vaginal discharge. Pap smear reported to be up to date. She had this concern evaluated before and was told that it was normal vaginal tissue.   Patient's psychiatrist is Dr. [name] in [place]. Next appt scheduled on 08/30 She sees a counselor once monthly.  Her PCP is Dr. Judithann Graves.       Past Medical History:  Diagnosis Date  . Allergy   . Anxiety   . Bipolar 1 disorder (HCC)   . Depression     Patient Active Problem List   Diagnosis Date Noted  . Closed fracture of  transverse process of lumbar vertebra (HCC) 09/25/2016  . Adderall use disorder, severe (HCC) 09/21/2016  . Cannabis use disorder, severe, dependence (HCC) 09/12/2016  . Cocaine use disorder, severe, dependence (HCC) 09/12/2016  . History of ADHD 09/12/2016  . History of bipolar disorder 09/12/2016  . Opioid use disorder, moderate, dependence (HCC) 09/12/2016  . 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 H/O abnormal cervical Papanicolaou smear 10/07/2015  . Incontinence 10/07/2015  . Major depressive disorder with single episode, in partial remission (HCC) 10/07/2015  . Aphthae 10/07/2015  . Anxiety 06/12/2014  . Chronic pain due to trauma 06/12/2014    Past Surgical History:  Procedure Laterality Date  . TONSILLECTOMY      OB History    No data available       Home Medications    Prior to Admission medications   Medication Sig Start Date End Date Taking? Authorizing Provider  cyclobenzaprine (FLEXERIL) 10 MG tablet TAKE 1 TABLET BY MOUTH THREE TIMES DAILY AS NEEDED FOR MUSCLE SPASM 06/04/17  Yes Reubin Milan, MD  divalproex (DEPAKOTE) 500 MG DR tablet Take 500 mg by mouth 3 (three) times daily.   Yes [provider]  escitalopram (LEXAPRO) 10 MG tablet Take 10 mg by mouth daily.   Yes [provider]  norethindrone-ethinyl estradiol (CYCLAFEM,ALYACEN) 0.5/0.75/1-35 MG-MCG tablet Take by mouth.   Yes [provider]  OLANZapine (ZYPREXA) 15 MG tablet Take 15 mg by mouth at bedtime.   Yes [provider]  ziprasidone (GEODON) 60 MG capsule Take 2 capsules by mouth at bedtime.   Yes [provider]  benztropine (COGENTIN) 0.5 MG tablet Take 0.5 mg by mouth 3 (three) times daily.    [provider]  cefUROXime (CEFTIN) 500 MG tablet Take 1 tablet (500 mg total) by mouth 2 (two) times daily with a meal. 01/10/17   Payton Mccallum, MD  haloperidol (HALDOL) 5 MG tablet Take 5 mg by mouth 2 (two) times  daily.    [provider]  hydrOXYzine (ATARAX/VISTARIL) 25 MG tablet Take 25 mg by mouth 3 (three) times daily as needed.    [provider]  ibuprofen (ADVIL,MOTRIN) 800 MG tablet TAKE 1 TABLET BY MOUTH EVERY 8 HOURS AS NEEDED 06/04/17   Reubin Milan, MD  imipramine (TOFRANIL) 50 MG tablet Take 50 mg by mouth at bedtime.    [provider]  meloxicam (MOBIC) 15 MG tablet Take 1 tablet (15 mg total) by mouth daily. 12/28/16   Hassan Rowan, MD  mupirocin ointment (BACTROBAN) 2 % Apply 1 application topically 3 (three) times daily. 12/28/16   Hassan Rowan, MD  naltrexone (DEPADE) 50 MG tablet Take by mouth daily.    [provider]    Family History Family History  Problem Relation Age of Onset  . Hyperlipidemia Father   . CAD Father   . Cancer Mother     Social History Social History  Substance Use Topics  . Smoking status: Current Every Day Smoker    Packs/day: 1.00    Types: Cigarettes  . Smokeless tobacco: Never Used  . Alcohol use No     Allergies   Patient has no known allergies.   Review of Systems Review of Systems  Constitutional:       See HPI     Physical Exam Triage Vital Signs ED Triage Vitals  Enc Vitals Group     BP 07/14/17 1242 (!) 150/100     Pulse Rate 07/14/17 1242 (!) 116     Resp 07/14/17 1242 18     Temp 07/14/17 1242 98.9 F (37.2 C)     Temp Source 07/14/17 1242 Oral     SpO2 07/14/17 1242 99 %     Weight --      Height --      Head Circumference --      Peak Flow --      Pain Score 07/14/17 1244 0     Pain Loc --      Pain Edu? --      Excl. in GC? --    No data found.   Updated Vital Signs BP (!) 150/100 (BP Location: Left Arm)   Pulse (!) 116   Temp 98.9 F (37.2 C) (Oral)   Resp 18   LMP 06/30/2017 (Exact Date)   SpO2 99%   Physical Exam  Constitutional: She is oriented to person, place, and time. She appears well-developed and well-nourished.  HENT:  Head: Normocephalic and  atraumatic.  Right Ear: External ear normal.  Left Ear: External ear normal.  Nose: Nose normal.  Mouth/Throat: No oropharyngeal exudate.  Eyes: Pupils are equal, round, and reactive to light. Conjunctivae are normal.  Neck: Normal range of motion. Neck supple.  Cardiovascular: Normal rate, regular rhythm and normal heart sounds.   No murmur heard. Pulmonary/Chest: Effort normal and breath sounds normal.  She has no wheezes.  Abdominal: Soft. Bowel sounds are normal.  Genitourinary:  Genitourinary Comments: Labia minor and majora symmetrical; no lesion.  Vaginal pink and most, no lesion. Normal vaginal rugae present. Did feel the two bead size lesion on the left mid canal that patient was particularly concern about. Cervix unremarkable, non-tender  Lymphadenopathy:    She has no cervical adenopathy.  Neurological: She is alert and oriented to person, place, and time.  Skin: Skin is warm and dry.  Psychiatric:  Anxious appearing, crying and emotional in room, flight of ideas presents, accelerated speech.   Nursing note and vitals reviewed.    UC Treatments / Results  Labs (all labs ordered are listed, but only abnormal results are displayed) Labs Reviewed - No data to display  EKG  EKG Interpretation None       Radiology No results found.  Procedures Procedures (including critical care time)  Medications Ordered in UC Medications - No data to display   Initial Impression / Assessment and Plan / UC Course  I have reviewed the triage vital signs and the nursing notes.  Pertinent labs & imaging results that were available during my care of the patient were reviewed by me and considered in my medical decision making (see chart for details).     Final Clinical Impressions(s) / UC Diagnoses   Final diagnoses:  None   Vaginal lesions: The two bead size lesions that you are feeling inside your vaginal canal is your normal vaginal tissues from the vaginal rugae.  Visually, they do not appear concerning. If they start to get bigger or becomes painful, then get in with an OBGYN for further evaluation.   Your numbness and tingling sensation of her fingers is due to your anxiety.   Your stool testing is negative today for blood. I did not see any hemorrhoid on your exam. Blood on tissue when you wiped could be from straining secondary to your constipation. If you continues to have rectal bleeding, then you may want to follow up with a gastroenterologist.   Your abdominal xray is normal today. I do not see the indication for stool culture or stool testing. If you desire for a CT scan, then you would need to get in with Dr. Gerrit HeckBeglund to discuss this.   Your next appointment with Dr. Fannie KneeSue the psychiatrist is 08/30, I believe you would benefit from seeing Dr. Fannie KneeSue earlier. Try and see if you can move your appointment up.   Addendum: I personally went over discharge paperwork with patient and addressed any more concern that she may have. Patient is now worry that I might have pushed the "worms" further in and further up when did check her stool earlier for occult blood, and requested to see the xray pictures for this reason. Reassurance provided again. Xray pictures shown to patient. Patient discharged with the instructions above.   New Prescriptions New Prescriptions   No medications on file    Controlled Substance Prescriptions [place] Controlled Substance Registry consulted? Not Applicable   Lucia EstelleZheng, Toniesha Zellner, NP 07/14/17 1413

## 2017-07-14 NOTE — Discharge Instructions (Signed)
Vaginal lesions: The two bead size lesions that you are feeling inside your vaginal canal is your normal vaginal tissues from the vaginal rugae. Visually, they do not appear concerning. If they start to get bigger or becomes painful, then get in with an OBGYN for further evaluation.   Your numbness and tingling sensation of her fingers is due to your anxiety.   Your stool testing is negative today for blood. I did not see any hemorrhoid on your exam. Blood on tissue when you wiped could be from straining secondary to your constipation. If you continues to have rectal bleeding, then you may want to follow up with a gastroenterologist.   Your abdominal xray is normal today. I do not see the indication for stool culture or stool testing. If you desire for a CT scan, then you would need to get in with Dr. Gerrit HeckBeglund to discuss this.   Your next appointment with Dr. Fannie KneeSue the psychiatrist is 08/30, I believe you would benefit from seeing Dr. Fannie KneeSue earlier. Try and see if you can move your appointment up.

## 2017-08-01 ENCOUNTER — Other Ambulatory Visit: Payer: Self-pay | Admitting: Internal Medicine

## 2017-08-01 DIAGNOSIS — M62830 Muscle spasm of back: Secondary | ICD-10-CM

## 2017-08-01 DIAGNOSIS — S20219A Contusion of unspecified front wall of thorax, initial encounter: Secondary | ICD-10-CM

## 2017-08-01 NOTE — Telephone Encounter (Signed)
Pt set up apt for 08/03/17

## 2017-08-01 NOTE — Telephone Encounter (Signed)
Please contact pt and let her know she needs to schedule a OV with Dr Judithann Graves to continue to get med refills. - Medication was denied until OV.

## 2017-08-03 ENCOUNTER — Encounter: Payer: Self-pay | Admitting: Internal Medicine

## 2017-08-03 ENCOUNTER — Ambulatory Visit (INDEPENDENT_AMBULATORY_CARE_PROVIDER_SITE_OTHER): Payer: BLUE CROSS/BLUE SHIELD | Admitting: Internal Medicine

## 2017-08-03 VITALS — BP 112/74 | HR 110 | Ht 67.0 in | Wt 286.4 lb

## 2017-08-03 DIAGNOSIS — M7071 Other bursitis of hip, right hip: Secondary | ICD-10-CM

## 2017-08-03 DIAGNOSIS — Z79899 Other long term (current) drug therapy: Secondary | ICD-10-CM | POA: Diagnosis not present

## 2017-08-03 DIAGNOSIS — M62830 Muscle spasm of back: Secondary | ICD-10-CM | POA: Diagnosis not present

## 2017-08-03 DIAGNOSIS — M722 Plantar fascial fibromatosis: Secondary | ICD-10-CM

## 2017-08-03 MED ORDER — PREDNISONE 10 MG PO TABS: ORAL_TABLET | ORAL | 0 refills | Status: DC

## 2017-08-03 MED ORDER — CYCLOBENZAPRINE HCL 10 MG PO TABS: 10.0000 mg | ORAL_TABLET | Freq: Three times a day (TID) | ORAL | 3 refills | Status: DC | PRN

## 2017-08-03 MED ORDER — IBUPROFEN 800 MG PO TABS: 800.0000 mg | ORAL_TABLET | Freq: Three times a day (TID) | ORAL | 3 refills | Status: DC | PRN

## 2017-08-03 NOTE — Patient Instructions (Signed)
Do not exceed 2400 mg ibuprofen per day.  You can take in between doses, tylenol 500 mg 3-4 times per day or aspirin 325 mg 3-4 times per day.

## 2017-08-03 NOTE — Progress Notes (Signed)
Date:  08/03/2017   Name:  Alyssa Allen   DOB:  October 22, 1991   MRN:  [id number]   Chief Complaint: Medication Refill (needs refill on Ibuprofen, and Flexeril. )  Foot Injury   Injury mechanism: Pain in both feet and heels that she thinks comes from walking and working on concrete at [place]. The pain is present in the right ankle, left foot and left heel. The quality of the pain is described as burning and aching. The pain is moderate. She reports no foreign bodies present. The symptoms are aggravated by weight bearing, palpation and movement.  Back Pain  This is a new problem. The current episode started 1 to 4 weeks ago. The problem occurs intermittently. The pain is present in the gluteal. The quality of the pain is described as aching. The pain radiates to the right thigh (and hip). Pertinent negatives include no abdominal pain, chest pain or fever.  Bipolar disorder/ADHD/Anxiety - followed by Dr. Janeece Riggers in [place]. On multiple medications. Planning to start a new one that she cannot name and recently had a CBC. She is on sure if she's had any kidney or liver function testing.    Review of Systems  Constitutional: Negative for chills, fever and unexpected weight change.  Respiratory: Negative for chest tightness, shortness of breath and wheezing.   Cardiovascular: Negative for chest pain and leg swelling.  Gastrointestinal: Negative for abdominal pain.  Musculoskeletal: Positive for arthralgias, back pain, gait problem and myalgias. Negative for joint swelling.    Patient Active Problem List   Diagnosis Date Noted  . Closed fracture of transverse process of lumbar vertebra (HCC) 09/25/2016  . Adderall use disorder, severe (HCC) 09/21/2016  . Cannabis use disorder, severe, dependence (HCC) 09/12/2016  . Cocaine use disorder, severe, dependence (HCC) 09/12/2016  . History of ADHD 09/12/2016  . History of bipolar disorder 09/12/2016  . Opioid use disorder, moderate, dependence (HCC)  09/12/2016  . 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 H/O abnormal cervical Papanicolaou smear 10/07/2015  . Incontinence 10/07/2015  . Major depressive disorder with single episode, in partial remission (HCC) 10/07/2015  . Anxiety 06/12/2014  . Chronic pain due to trauma 06/12/2014    Prior to Admission medications   Medication Sig Start Date End Date Taking? Authorizing Provider  busPIRone (BUSPAR) 15 MG tablet Take 15 mg by mouth 3 (three) times daily. 08/02/17  Yes [provider]  cyclobenzaprine (FLEXERIL) 10 MG tablet TAKE 1 TABLET BY MOUTH THREE TIMES DAILY AS NEEDED FOR MUSCLE SPASM 06/04/17  Yes Reubin Milan, MD  divalproex (DEPAKOTE) 500 MG DR tablet Take 500 mg by mouth 3 (three) times daily.   Yes [provider]  escitalopram (LEXAPRO) 10 MG tablet Take 10 mg by mouth daily.   Yes [provider]  ibuprofen (ADVIL,MOTRIN) 800 MG tablet TAKE 1 TABLET BY MOUTH EVERY 8 HOURS AS NEEDED 06/04/17  Yes Reubin Milan, MD  norethindrone-ethinyl estradiol (CYCLAFEM,ALYACEN) 0.5/0.75/1-35 MG-MCG tablet Take by mouth.   Yes [provider]  ziprasidone (GEODON) 60 MG capsule Take 2 capsules by mouth at bedtime.   Yes [provider]    No Known Allergies  Past Surgical History:  Procedure Laterality Date  . TONSILLECTOMY      Social History  Substance Use Topics  . Smoking status: Current Every Day Smoker    Packs/day: 1.00    Types: Cigarettes  . Smokeless tobacco: Never Used  . Alcohol use No  Medication list has been reviewed and updated.  PHQ 2/9 Scores 09/25/2016  Exception Documentation Other- indicate reason in comment box  Not completed pych involved     Physical Exam  Constitutional: She is oriented to person, place, and time. She appears well-developed. No distress.  HENT:  Head: Normocephalic and atraumatic.  Cardiovascular: Normal rate, regular rhythm and normal heart  sounds.   Pulmonary/Chest: Effort normal and breath sounds normal. No respiratory distress.  Musculoskeletal:       Right ankle: She exhibits decreased range of motion. She exhibits no swelling, no ecchymosis and no deformity.       Left ankle: She exhibits normal range of motion. Tenderness (forefoot and on plantar aspect of foot at heel).  Tender over right flank and right lateral hip  Neurological: She is alert and oriented to person, place, and time.  Skin: Skin is warm and dry. No rash noted.  Psychiatric: Her speech is normal and behavior is normal. Thought content normal. Her affect is blunt.  Nursing note and vitals reviewed.   BP 112/74   Pulse (!) 110   Ht 5\' 7"  (1.702 m)   Wt 286 lb 6.4 oz (129.9 kg)   LMP 07/25/2017 (Exact Date)   SpO2 98%   BMI 44.86 kg/m   Assessment and Plan: 1. Plantar fasciitis Continue ibuprofen and ice - Ambulatory referral to Podiatry  2. Bursitis of right hip, unspecified bursa Should respond to prednisone taper - predniSONE (DELTASONE) 10 MG tablet; Take 6 on day 1, 5 on day 2, 4 on day 3, 3 on day 4, 2 on day 5 and 1 on day 1 then stop.  Dispense: 21 tablet; Refill: 0  3. Muscle spasm of back Continue ibuprofen with maximum dose 2400 mg/day Continue flexeril as needed No indication that she needs stronger pain medication  - ibuprofen (ADVIL,MOTRIN) 800 MG tablet; Take 1 tablet (800 mg total) by mouth every 8 (eight) hours as needed.  Dispense: 90 tablet; Refill: 3 - cyclobenzaprine (FLEXERIL) 10 MG tablet; Take 1 tablet (10 mg total) by mouth 3 (three) times daily as needed. for muscle spams  Dispense: 90 tablet; Refill: 3  4. Other long term (current) drug therapy nsaids at high dose - Comprehensive metabolic panel   Meds ordered this encounter  Medications  . ibuprofen (ADVIL,MOTRIN) 800 MG tablet    Sig: Take 1 tablet (800 mg total) by mouth every 8 (eight) hours as needed.    Dispense:  90 tablet    Refill:  3    Please  consider 90 day supplies to promote better adherence  . cyclobenzaprine (FLEXERIL) 10 MG tablet    Sig: Take 1 tablet (10 mg total) by mouth 3 (three) times daily as needed. for muscle spams    Dispense:  90 tablet    Refill:  3    Please consider 90 day supplies to promote better adherence  . predniSONE (DELTASONE) 10 MG tablet    Sig: Take 6 on day 1, 5 on day 2, 4 on day 3, 3 on day 4, 2 on day 5 and 1 on day 1 then stop.    Dispense:  21 tablet    Refill:  0    Partially dictated using Animal nutritionistDragon software. Any errors are unintentional.  Bari EdwardLaura Shanterria Franta, MD [facility]  08/03/2017

## 2017-08-08 NOTE — Progress Notes (Signed)
Called pt and left Vm concerning getting labs done-

## 2017-10-22 ENCOUNTER — Ambulatory Visit
Admission: RE | Admit: 2017-10-22 | Discharge: 2017-10-22 | Disposition: A | Discharge status: Home / Self Care | Payer: Disability Insurance | Source: Ambulatory Visit | Attending: Dentistry | Admitting: Dentistry

## 2017-10-22 ENCOUNTER — Other Ambulatory Visit: Payer: Self-pay | Admitting: Dentistry

## 2017-10-22 DIAGNOSIS — F810 Specific reading disorder: Secondary | ICD-10-CM | POA: Diagnosis not present

## 2017-10-22 DIAGNOSIS — F419 Anxiety disorder, unspecified: Secondary | ICD-10-CM | POA: Insufficient documentation

## 2017-10-22 DIAGNOSIS — M545 Low back pain: Secondary | ICD-10-CM | POA: Diagnosis not present

## 2017-10-22 DIAGNOSIS — S3992XA Unspecified injury of lower back, initial encounter: Secondary | ICD-10-CM

## 2017-10-22 DIAGNOSIS — F329 Major depressive disorder, single episode, unspecified: Secondary | ICD-10-CM | POA: Diagnosis not present

## 2017-10-22 DIAGNOSIS — G8929 Other chronic pain: Secondary | ICD-10-CM | POA: Diagnosis not present

## 2017-12-06 ENCOUNTER — Other Ambulatory Visit: Payer: Self-pay | Admitting: Internal Medicine

## 2017-12-06 DIAGNOSIS — M62830 Muscle spasm of back: Secondary | ICD-10-CM

## 2018-04-16 ENCOUNTER — Other Ambulatory Visit: Payer: Self-pay | Admitting: Internal Medicine

## 2018-04-16 DIAGNOSIS — M62830 Muscle spasm of back: Secondary | ICD-10-CM

## 2018-05-21 ENCOUNTER — Other Ambulatory Visit: Payer: Self-pay | Admitting: Internal Medicine

## 2018-05-21 DIAGNOSIS — M62830 Muscle spasm of back: Secondary | ICD-10-CM

## 2018-07-22 ENCOUNTER — Other Ambulatory Visit: Payer: Self-pay | Admitting: Internal Medicine

## 2018-07-22 DIAGNOSIS — M62830 Muscle spasm of back: Secondary | ICD-10-CM

## 2018-08-23 ENCOUNTER — Other Ambulatory Visit: Payer: Self-pay | Admitting: Internal Medicine

## 2018-08-23 DIAGNOSIS — M62830 Muscle spasm of back: Secondary | ICD-10-CM

## 2018-09-11 ENCOUNTER — Other Ambulatory Visit: Payer: Self-pay | Admitting: Internal Medicine

## 2018-09-11 DIAGNOSIS — M62830 Muscle spasm of back: Secondary | ICD-10-CM

## 2018-10-18 ENCOUNTER — Other Ambulatory Visit: Payer: Self-pay | Admitting: Internal Medicine

## 2018-10-18 DIAGNOSIS — M62830 Muscle spasm of back: Secondary | ICD-10-CM

## 2018-11-25 NOTE — Telephone Encounter (Signed)
Lvm to call back-ah11/19@1 :30 Lvm to call back -ah12/12@3 :08

## 2018-12-05 NOTE — Telephone Encounter (Signed)
lvm to call back -ah01/02 °

## 2019-01-03 NOTE — Telephone Encounter (Signed)
This patient is no longer seeing Dr Judithann Graves.

## 2019-01-03 NOTE — Telephone Encounter (Signed)
Please Advise. Patient no longer seeing you as PCP. Removed you from care teams.

## 2019-08-18 ENCOUNTER — Other Ambulatory Visit: Payer: Self-pay | Admitting: Internal Medicine

## 2019-08-18 DIAGNOSIS — M62830 Muscle spasm of back: Secondary | ICD-10-CM

## 2019-11-15 ENCOUNTER — Other Ambulatory Visit: Payer: Self-pay

## 2019-11-15 ENCOUNTER — Encounter: Payer: Self-pay | Admitting: Emergency Medicine

## 2019-11-15 ENCOUNTER — Emergency Department
Admission: EM | Admit: 2019-11-15 | Discharge: 2019-11-18 | Disposition: A | Discharge status: Other Acute Inpatient Hospital | Payer: Medicaid Other | Attending: Emergency Medicine | Admitting: Emergency Medicine

## 2019-11-15 DIAGNOSIS — Z008 Encounter for other general examination: Secondary | ICD-10-CM

## 2019-11-15 DIAGNOSIS — Z79899 Other long term (current) drug therapy: Secondary | ICD-10-CM | POA: Insufficient documentation

## 2019-11-15 DIAGNOSIS — Z046 Encounter for general psychiatric examination, requested by authority: Secondary | ICD-10-CM | POA: Diagnosis not present

## 2019-11-15 DIAGNOSIS — F3164 Bipolar disorder, current episode mixed, severe, with psychotic features: Secondary | ICD-10-CM | POA: Diagnosis not present

## 2019-11-15 DIAGNOSIS — F22 Delusional disorders: Secondary | ICD-10-CM

## 2019-11-15 DIAGNOSIS — F17210 Nicotine dependence, cigarettes, uncomplicated: Secondary | ICD-10-CM | POA: Diagnosis not present

## 2019-11-15 DIAGNOSIS — R440 Auditory hallucinations: Secondary | ICD-10-CM | POA: Diagnosis present

## 2019-11-15 DIAGNOSIS — Z20828 Contact with and (suspected) exposure to other viral communicable diseases: Secondary | ICD-10-CM | POA: Diagnosis not present

## 2019-11-15 LAB — COMPREHENSIVE METABOLIC PANEL
ALT: 19 U/L (ref 0–44)
AST: 18 U/L (ref 15–41)
Albumin: 4.7 g/dL (ref 3.5–5.0)
Alkaline Phosphatase: 50 U/L (ref 38–126)
Anion gap: 12 (ref 5–15)
BUN: 15 mg/dL (ref 6–20)
CO2: 20 mmol/L — ABNORMAL LOW (ref 22–32)
Calcium: 9.3 mg/dL (ref 8.9–10.3)
Chloride: 104 mmol/L (ref 98–111)
Creatinine, Ser: 0.78 mg/dL (ref 0.44–1.00)
GFR calc Af Amer: >60 mL/min (ref >60)
GFR calc non Af Amer: >60 mL/min (ref >60)
Glucose, Bld: 102 mg/dL — ABNORMAL HIGH (ref 70–99)
Potassium: 3.9 mmol/L (ref 3.5–5.1)
Sodium: 136 mmol/L (ref 135–145)
Total Bilirubin: 0.6 mg/dL (ref 0.3–1.2)
Total Protein: 8.2 g/dL — ABNORMAL HIGH (ref 6.5–8.1)

## 2019-11-15 LAB — URINE DRUG SCREEN, QUALITATIVE (ARMC ONLY)
Amphetamines, Ur Screen: NOT DETECTED
Barbiturates, Ur Screen: NOT DETECTED
Benzodiazepine, Ur Scrn: POSITIVE — AB
Cannabinoid 50 Ng, Ur ~~LOC~~: POSITIVE — AB
Cocaine Metabolite,Ur ~~LOC~~: NOT DETECTED
MDMA (Ecstasy)Ur Screen: NOT DETECTED
Methadone Scn, Ur: NOT DETECTED
Opiate, Ur Screen: NOT DETECTED
Phencyclidine (PCP) Ur S: NOT DETECTED
Tricyclic, Ur Screen: POSITIVE — AB

## 2019-11-15 LAB — CBC
HCT: 37.6 % (ref 36.0–46.0)
Hemoglobin: 12.7 g/dL (ref 12.0–15.0)
MCH: 29.7 pg (ref 26.0–34.0)
MCHC: 33.8 g/dL (ref 30.0–36.0)
MCV: 88.1 fL (ref 80.0–100.0)
Platelets: 327 10*3/uL (ref 150–400)
RBC: 4.27 MIL/uL (ref 3.87–5.11)
RDW: 12.6 % (ref 11.5–15.5)
WBC: 12.3 10*3/uL — ABNORMAL HIGH (ref 4.0–10.5)
nRBC: 0 % (ref 0.0–0.2)

## 2019-11-15 LAB — POCT PREGNANCY, URINE: Preg Test, Ur: NEGATIVE

## 2019-11-15 LAB — SARS CORONAVIRUS 2 (TAT 6-24 HRS): SARS Coronavirus 2: NEGATIVE

## 2019-11-15 LAB — ACETAMINOPHEN LEVEL: Acetaminophen (Tylenol), Serum: <10 ug/mL — ABNORMAL LOW (ref 10–30)

## 2019-11-15 LAB — SALICYLATE LEVEL: Salicylate Lvl: <7 mg/dL (ref 2.8–30.0)

## 2019-11-15 LAB — ETHANOL: Alcohol, Ethyl (B): <10 mg/dL (ref <10)

## 2019-11-15 MED ORDER — LORAZEPAM 1 MG PO TABS
1.0000 mg | ORAL_TABLET | Freq: Two times a day (BID) | ORAL | Status: DC
  Administered 2019-11-17 – 2019-11-18 (×3): 1 mg via ORAL
  Filled 2019-11-15 (×4): qty 1

## 2019-11-15 MED ORDER — ZIPRASIDONE HCL 80 MG PO CAPS: 120.0000 mg | ORAL_CAPSULE | Freq: Every day | ORAL | Status: DC

## 2019-11-15 MED ORDER — BUSPIRONE HCL 10 MG PO TABS
15.0000 mg | ORAL_TABLET | Freq: Three times a day (TID) | ORAL | Status: DC
  Administered 2019-11-15 – 2019-11-18 (×9): 15 mg via ORAL
  Filled 2019-11-15 (×9): qty 2

## 2019-11-15 MED ORDER — GABAPENTIN 100 MG PO CAPS
100.0000 mg | ORAL_CAPSULE | Freq: Three times a day (TID) | ORAL | Status: DC
  Administered 2019-11-15 – 2019-11-18 (×9): 100 mg via ORAL
  Filled 2019-11-15 (×9): qty 1

## 2019-11-15 MED ORDER — NICOTINE 21 MG/24HR TD PT24
21.0000 mg | MEDICATED_PATCH | Freq: Once | TRANSDERMAL | Status: AC
  Administered 2019-11-15: 19:00:00 21 mg via TRANSDERMAL
  Filled 2019-11-15: qty 1

## 2019-11-15 MED ORDER — ZIPRASIDONE HCL 20 MG PO CAPS
60.0000 mg | ORAL_CAPSULE | Freq: Two times a day (BID) | ORAL | Status: DC
  Administered 2019-11-15 – 2019-11-18 (×7): 60 mg via ORAL
  Filled 2019-11-15 (×4): qty 3
  Filled 2019-11-15: qty 1
  Filled 2019-11-15 (×2): qty 3

## 2019-11-15 MED ORDER — LORAZEPAM 2 MG/ML IJ SOLN
2.0000 mg | Freq: Once | INTRAMUSCULAR | Status: DC
  Filled 2019-11-15: qty 1

## 2019-11-15 MED ORDER — DIVALPROEX SODIUM 500 MG PO DR TAB: 500.0000 mg | DELAYED_RELEASE_TABLET | Freq: Three times a day (TID) | ORAL | Status: DC

## 2019-11-15 MED ORDER — ESCITALOPRAM OXALATE 10 MG PO TABS: 10.0000 mg | ORAL_TABLET | Freq: Every day | ORAL | Status: DC

## 2019-11-15 MED ORDER — NORETHIN-ETH ESTRAD TRIPHASIC 0.5/0.75/1-35 MG-MCG PO TABS: 1.0000 | ORAL_TABLET | Freq: Every day | ORAL | Status: DC

## 2019-11-15 MED ORDER — HALOPERIDOL LACTATE 5 MG/ML IJ SOLN
5.0000 mg | Freq: Once | INTRAMUSCULAR | Status: AC
  Administered 2019-11-15: 15:00:00 5 mg via INTRAMUSCULAR
  Filled 2019-11-15: qty 1

## 2019-11-15 MED ORDER — LORAZEPAM 2 MG/ML IJ SOLN
2.0000 mg | Freq: Once | INTRAMUSCULAR | Status: AC
  Administered 2019-11-15: 2 mg via INTRAMUSCULAR

## 2019-11-15 MED ORDER — BACITRACIN ZINC 500 UNIT/GM EX OINT
TOPICAL_OINTMENT | Freq: Every day | CUTANEOUS | Status: DC
  Administered 2019-11-15: 1 via TOPICAL
  Filled 2019-11-15: qty 0.9

## 2019-11-15 NOTE — ED Notes (Signed)
Hourly rounding reveals patient sleeping in room. No complaints, stable, in no acute distress. Q15 minute rounds and monitoring via [employer] Cam[employer] to continue. 

## 2019-11-15 NOTE — Consult Note (Addendum)
[facility] Face-to-Face Psychiatry Consult   Reason for Consult:  Ps ychosis Referring Physician:  EDP Patient Identification: Alyssa Allen MRN:  [id number] Principal Diagnosis: Bipolar affective disorder, mixed, severe, with psychotic behavior (HCC) Diagnosis:  Principal Problem:   Bipolar affective disorder, mixed, severe, with psychotic behavior (HCC)  Total Time spent with patient: 1 hour  Subjective:   Alyssa Allen is a 28 y.o. female patient admitted with psychosis.  "I'm just ready to go home."  Patient seen and evaluated in person by this provider.  She is clearly responding to internal stimuli on assessment but continues to deny this.  She is focused on discharge with no insight to her psychosis.  Reports people are watching her and have been for the past 3 years.  Talks about only living with her father but other people being in her home.  Agitated on admission and as needed medications given by the EDP.  Past history of polysubstance abuse and bipolar disorder.  She denies suicidal/homicidal ideations but continues to be psychotic and in need of psychiatric hospitalization.  HPI on admission:  Alyssa Allen is a 28 y.o. female with anxiety, bipolar, depression who comes in under IVC.  Per patient she states that [facility] implanted a computer and her that is controlling her.  She states that it sounds crazy but it is true.  She denies any HI SI.  Does endorse auditory hallucinations no visual hallucinations.  Denies alcohol use.  She states that she has been taking her medications.  Denies any other medical concerns such as shortness of breath, cough, chest pain, fevers  Past Psychiatric History: substance use d/o, bipolar d/o  Risk to Self:  yes Risk to Others:  none Prior Inpatient Therapy:  yes Prior Outpatient Therapy:  CBC  Past Medical History:  Past Medical History:  Diagnosis Date  . Allergy   . Anxiety   . Bipolar 1 disorder (HCC)   . Depression     Past  Surgical History:  Procedure Laterality Date  . TONSILLECTOMY     Family History:  Family History  Problem Relation Age of Onset  . Hyperlipidemia Father   . CAD Father   . Cancer Mother    Family Psychiatric  History: none Social History:  Social History   Substance and Sexual Activity  Alcohol Use No  . Alcohol/week: 0.0 standard drinks     Social History   Substance and Sexual Activity  Drug Use Yes  . Frequency: 1.0 times per week  . Types: Cocaine, Marijuana   Comment: used 09/24/2016    Social History   Socioeconomic History  . Marital status: Single    Spouse name: Not on file  . Number of children: Not on file  . Years of education: Not on file  . Highest education level: Not on file  Occupational History  . Not on file  Tobacco Use  . Smoking status: Current Every Day Smoker    Packs/day: 1.00    Types: Cigarettes  . Smokeless tobacco: Never Used  Substance and Sexual Activity  . Alcohol use: No    Alcohol/week: 0.0 standard drinks  . Drug use: Yes    Frequency: 1.0 times per week    Types: Cocaine, Marijuana    Comment: used 09/24/2016  . Sexual activity: Not on file  Other Topics Concern  . Not on file  Social History Narrative  . Not on file   Social Determinants of Health   Financial Resource Strain:   .  Difficulty of Paying Living Expenses: Not on file  Food Insecurity:   . Worried About Programme researcher, broadcasting/film/video in the Last Year: Not on file  . Ran Out of Food in the Last Year: Not on file  Transportation Needs:   . Lack of Transportation (Medical): Not on file  . Lack of Transportation (Non-Medical): Not on file  Physical Activity:   . Days of Exercise per Week: Not on file  . Minutes of Exercise per Session: Not on file  Stress:   . Feeling of Stress : Not on file  Social Connections:   . Frequency of Communication with Friends and Family: Not on file  . Frequency of Social Gatherings with Friends and Family: Not on file  . Attends  Religious Services: Not on file  . Active Member of Clubs or Organizations: Not on file  . Attends Banker Meetings: Not on file  . Marital Status: Not on file   Additional Social History:    Allergies:  No Known Allergies  Labs:  Results for orders placed or performed during the hospital encounter of 11/15/19 (from the past 48 hour(s))  Urine Drug Screen, Qualitative     Status: Abnormal   Collection Time: 11/15/19  1:57 PM  Result Value Ref Range   Tricyclic, Ur Screen POSITIVE (A) NONE DETECTED   Amphetamines, Ur Screen NONE DETECTED NONE DETECTED   MDMA (Ecstasy)Ur Screen NONE DETECTED NONE DETECTED   Cocaine Metabolite,Ur [place] NONE DETECTED NONE DETECTED   Opiate, Ur Screen NONE DETECTED NONE DETECTED   Phencyclidine (PCP) Ur S NONE DETECTED NONE DETECTED   Cannabinoid 50 Ng, Ur [place] POSITIVE (A) NONE DETECTED   Barbiturates, Ur Screen NONE DETECTED NONE DETECTED   Benzodiazepine, Ur Scrn POSITIVE (A) NONE DETECTED   Methadone Scn, Ur NONE DETECTED NONE DETECTED    Comment: (NOTE) Tricyclics + metabolites, urine    Cutoff 1000 ng/mL Amphetamines + metabolites, urine  Cutoff 1000 ng/mL MDMA (Ecstasy), urine              Cutoff 500 ng/mL Cocaine Metabolite, urine          Cutoff 300 ng/mL Opiate + metabolites, urine        Cutoff 300 ng/mL Phencyclidine (PCP), urine         Cutoff 25 ng/mL Cannabinoid, urine                 Cutoff 50 ng/mL Barbiturates + metabolites, urine  Cutoff 200 ng/mL Benzodiazepine, urine              Cutoff 200 ng/mL Methadone, urine                   Cutoff 300 ng/mL The urine drug screen provides only a preliminary, unconfirmed analytical test result and should not be used for non-medical purposes. Clinical consideration and professional judgment should be applied to any positive drug screen result due to possible interfering substances. A more specific alternate chemical method must be used in order to obtain a confirmed analytical  result. Gas chromatography / mass spectrometry (GC/MS) is the preferred confirmat ory method. Performed at [facility], [address] [place], [place] [address]   Comprehensive metabolic panel     Status: Abnormal   Collection Time: 11/15/19  2:11 PM  Result Value Ref Range   Sodium 136 135 - 145 mmol/L   Potassium 3.9 3.5 - 5.1 mmol/L   Chloride 104 98 - 111 mmol/L  CO2 20 (L) 22 - 32 mmol/L   Glucose, Bld 102 (H) 70 - 99 mg/dL   BUN 15 6 - 20 mg/dL   Creatinine, Ser 1.61 0.44 - 1.00 mg/dL   Calcium 9.3 8.9 - 09.6 mg/dL   Total Protein 8.2 (H) 6.5 - 8.1 g/dL   Albumin 4.7 3.5 - 5.0 g/dL   AST 18 15 - 41 U/L   ALT 19 0 - 44 U/L   Alkaline Phosphatase 50 38 - 126 U/L   Total Bilirubin 0.6 0.3 - 1.2 mg/dL   GFR calc non Af Amer >60 >60 mL/min   GFR calc Af Amer >60 >60 mL/min   Anion gap 12 5 - 15    Comment: Performed at [facility], [address], [place], [place] [address]  Ethanol     Status: None   Collection Time: 11/15/19  2:11 PM  Result Value Ref Range   Alcohol, Ethyl (B) <10 <10 mg/dL    Comment: (NOTE) Lowest detectable limit for serum alcohol is 10 mg/dL. For medical purposes only. Performed at [facility], [address] [place], [place] [address]   Salicylate level     Status: None   Collection Time: 11/15/19  2:11 PM  Result Value Ref Range   Salicylate Lvl <7.0 2.8 - 30.0 mg/dL    Comment: Performed at [facility], [address] [place], [place] [address]  Acetaminophen level     Status: Abnormal   Collection Time: 11/15/19  2:11 PM  Result Value Ref Range   Acetaminophen (Tylenol), Serum <10 (L) 10 - 30 ug/mL    Comment: (NOTE) Therapeutic concentrations vary significantly. A range of 10-30 ug/mL  may be an effective concentration for many patients. However, some  are best treated at concentrations outside of this range. Acetaminophen concentrations >150 ug/mL at 4 hours after ingestion  and >50  ug/mL at 12 hours after ingestion are often associated with  toxic reactions. Performed at [facility], [address] [place], [place] [address]   cbc     Status: Abnormal   Collection Time: 11/15/19  2:11 PM  Result Value Ref Range   WBC 12.3 (H) 4.0 - 10.5 K/uL   RBC 4.27 3.87 - 5.11 MIL/uL   Hemoglobin 12.7 12.0 - 15.0 g/dL   HCT 30.8 65.7 - 84.6 %   MCV 88.1 80.0 - 100.0 fL   MCH 29.7 26.0 - 34.0 pg   MCHC 33.8 30.0 - 36.0 g/dL   RDW 96.2 95.2 - 84.1 %   Platelets 327 150 - 400 K/uL   nRBC 0.0 0.0 - 0.2 %    Comment: Performed at [facility], [address] [place], [place] [address]  Pregnancy, urine POC     Status: None   Collection Time: 11/15/19  2:26 PM  Result Value Ref Range   Preg Test, Ur NEGATIVE NEGATIVE    Comment:        THE SENSITIVITY OF THIS METHODOLOGY IS >24 mIU/mL     No current facility-administered medications for this encounter.   Current Outpatient Medications  Medication Sig Dispense Refill  . busPIRone (BUSPAR) 15 MG tablet Take 15 mg by mouth 3 (three) times daily.  0  . cyclobenzaprine (FLEXERIL) 10 MG tablet TAKE 1 TABLET BY MOUTH THREE TIMES DAILY AS NEEDED FOR MUSCLE SPASM 90 tablet 0  . divalproex (DEPAKOTE) 500 MG DR tablet Take 500 mg by mouth 3 (three) times daily.    Marland Kitchen  escitalopram (LEXAPRO) 10 MG tablet Take 10 mg by mouth daily.    Marland Kitchen. ibuprofen (ADVIL) 800 MG tablet TAKE 1 TABLET BY MOUTH EVERY 8 HOURS AS NEEDED 90 tablet 0  . norethindrone-ethinyl estradiol (CYCLAFEM,ALYACEN) 0.5/0.75/1-35 MG-MCG tablet Take by mouth.    . predniSONE (DELTASONE) 10 MG tablet Take 6 on day 1, 5 on day 2, 4 on day 3, 3 on day 4, 2 on day 5 and 1 on day 1 then stop. 21 tablet 0  . ziprasidone (GEODON) 60 MG capsule Take 2 capsules by mouth at bedtime.      Musculoskeletal: Strength & Muscle Tone: within normal limits Gait & Station: normal Patient leans: N/A  Psychiatric Specialty Exam: Physical Exam  Nursing note and  vitals reviewed. Constitutional: She is oriented to person, place, and time. She appears well-developed and well-nourished.  HENT:  Head: Normocephalic.  Respiratory: Effort normal.  Musculoskeletal:        General: Normal range of motion.     Cervical back: Normal range of motion.  Neurological: She is alert and oriented to person, place, and time.  Psychiatric: Her speech is normal. Her mood appears anxious. Her affect is blunt. She is actively hallucinating. Thought content is paranoid and delusional. Cognition and memory are impaired. She expresses impulsivity.    Review of Systems  Psychiatric/Behavioral: Positive for hallucinations. The patient is nervous/anxious.   All other systems reviewed and are negative.   Blood pressure (!) 161/89, pulse (!) 112, temperature 98.9 F (37.2 C), temperature source Oral, resp. rate 20, height 5\' 7"  (1.702 m), weight 108.9 kg, last menstrual period 11/01/2019, SpO2 97 %.Body mass index is 37.59 kg/m.  General Appearance: Casual  Eye Contact:  Fair  Speech:  Normal Rate  Volume:  Normal  Mood:  Anxious and Irritable  Affect:  Flat  Thought Process:  Coherent and Descriptions of Associations: Intact  Orientation:  Full (Time, Place, and Person)  Thought Content:  Delusions, Hallucinations: Auditory Visual and Paranoid Ideation  Suicidal Thoughts:  No  Homicidal Thoughts:  No  Memory:  Immediate;   Fair Recent;   Fair Remote;   Fair  Judgement:  Impaired  Insight:  Lacking  Psychomotor Activity:  Increased  Concentration:  Concentration: Fair and Attention Span: Fair  Recall:  [employer]  Fund of Knowledge:  Fair  Language:  Fair  Akathisia:  No  Handed:  Right  AIMS (if indicated):     Assets:  Housing Leisure Time Physical Health Resilience Social Support  ADL's:  Intact  Cognition:  Impaired,  Mild  Sleep:      28 year old female with a long history of bipolar disorder and substance abuse.  IVC'd by her father with concerns of  paranoia that people have inserted a chip in her and are listening to her thoughts.  On assessment, she is responding to internal stimuli with paranoia that people are watching her and are in her home.  Lives with her father and no one else in the home.  Patient warrants psychiatric admission at this time.  Treatment Plan Summary: Daily contact with patient to assess and evaluate symptoms and progress in treatment, Medication management and Plan bipolar affective disorder, mixed, severe with psychosis:  -changed Geodon 120 mg at bedtime to 60 mg BID  Anxiety: -Continue Buspar 15 mg TID -Change Xanax 2 mg BID to Ativan 1 mg BID with the goal to taper off -Restarted gabapentin at 100 mg TID  Disposition: Recommend psychiatric Inpatient admission when medically  cleared.  Waylan Boga, NP 11/15/2019 4:33 PM   Case discussed and plan agreed on as per above.

## 2019-11-15 NOTE — ED Notes (Signed)
Medications given as ordered. RN showed patient each the pill package and opened them in front of her.  Pills taken with apple juice. Pt opened container and gave back to RN when finished.

## 2019-11-15 NOTE — ED Notes (Signed)
Pt at nurse's station window. "Are you just going to sit there?"   Pt informed by staff they were doing their documentation.

## 2019-11-15 NOTE — ED Notes (Signed)
Pt threw down the papers she was holding and yelled out, "What are they saying about my family?"   Minutes later patient picked up her papers and returned to her room.

## 2019-11-15 NOTE — ED Notes (Signed)
Hourly rounding reveals patient in room. No complaints, stable, in no acute distress. Q15 minute rounds and monitoring via [employer] to continue. 

## 2019-11-15 NOTE — ED Notes (Signed)
Jamie, NP at bedside.  

## 2019-11-15 NOTE — ED Notes (Signed)
Pt standing at doorway continuously. Pt not yelling or uncooperative at this time but remains paranoid and is monitoring everyone who walks anywhere near pt.

## 2019-11-15 NOTE — ED Provider Notes (Signed)
[facility] Emergency Department Provider Note  ____________________________________________   None    (approximate)  I have reviewed the triage vital signs and the nursing notes.   HISTORY  Chief Complaint IVC    HPI Alyssa Allen is a 28 y.o. female with anxiety, bipolar, depression who comes in under IVC.  Per patient she states that [facility] implanted a computer and her that is controlling her.  She states that it sounds crazy but it is true.  She denies any HI SI.  Does endorse auditory hallucinations no visual hallucinations.  Denies alcohol use.  She states that she has been taking her medications.  Denies any other medical concerns such as shortness of breath, cough, chest pain, fevers          Past Medical History:  Diagnosis Date  . Allergy   . Anxiety   . Bipolar 1 disorder (HCC)   . Depression     Patient Active Problem List   Diagnosis Date Noted  . Closed fracture of transverse process of lumbar vertebra (HCC) 09/25/2016  . Adderall use disorder, severe (HCC) 09/21/2016  . Cannabis use disorder, severe, dependence (HCC) 09/12/2016  . Cocaine use disorder, severe, dependence (HCC) 09/12/2016  . History of ADHD 09/12/2016  . History of bipolar disorder 09/12/2016  . Opioid use disorder, moderate, dependence (HCC) 09/12/2016  . 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 H/O abnormal cervical Papanicolaou smear 10/07/2015  . Incontinence 10/07/2015  . Major depressive disorder with single episode, in partial remission (HCC) 10/07/2015  . Anxiety 06/12/2014  . Chronic pain due to trauma 06/12/2014    Past Surgical History:  Procedure Laterality Date  . TONSILLECTOMY      Prior to Admission medications   Medication Sig Start Date End Date Taking? Authorizing Provider  busPIRone (BUSPAR) 15 MG tablet Take 15 mg by mouth 3 (three) times daily. 08/02/17   [provider]  cyclobenzaprine  (FLEXERIL) 10 MG tablet TAKE 1 TABLET BY MOUTH THREE TIMES DAILY AS NEEDED FOR MUSCLE SPASM 10/19/18   Reubin Milan, MD  divalproex (DEPAKOTE) 500 MG DR tablet Take 500 mg by mouth 3 (three) times daily.    [provider]  escitalopram (LEXAPRO) 10 MG tablet Take 10 mg by mouth daily.    [provider]  ibuprofen (ADVIL) 800 MG tablet TAKE 1 TABLET BY MOUTH EVERY 8 HOURS AS NEEDED 08/18/19   Reubin Milan, MD  norethindrone-ethinyl estradiol [facility]) 0.5/0.75/1-35 MG-MCG tablet Take by mouth.    [provider]  predniSONE (DELTASONE) 10 MG tablet Take 6 on day 1, 5 on day 2, 4 on day 3, 3 on day 4, 2 on day 5 and 1 on day 1 then stop. 08/03/17   Reubin Milan, MD  ziprasidone (GEODON) 60 MG capsule Take 2 capsules by mouth at bedtime.    [provider]  ibuprofen (ADVIL,MOTRIN) 800 MG tablet TAKE 1 TABLET BY MOUTH EVERY 8 HOURS AS NEEDED 09/12/18   Reubin Milan, MD    Allergies Patient has no known allergies.  Family History  Problem Relation Age of Onset  . Hyperlipidemia Father   . CAD Father   . Cancer Mother     Social History Social History   Tobacco Use  . Smoking status: Current Every Day Smoker    Packs/day: 1.00    Types: Cigarettes  . Smokeless tobacco: Never Used  Substance Use Topics  . Alcohol use: No  Alcohol/week: 0.0 standard drinks  . Drug use: Yes    Frequency: 1.0 times per week    Types: Cocaine, Marijuana    Comment: used 09/24/2016      Review of Systems Constitutional: No fever/chills Eyes: No visual changes. ENT: No sore throat. Cardiovascular: Denies chest pain. Respiratory: Denies shortness of breath. Gastrointestinal: No abdominal pain.  No nausea, no vomiting.  No diarrhea.  No constipation. Genitourinary: Negative for dysuria. Musculoskeletal: Negative for back pain. Skin: Negative for rash. Neurological: Negative for headaches, focal weakness or numbness. Psych:  Hallucinations delusions paranoia All other ROS negative ____________________________________________   PHYSICAL EXAM:  VITAL SIGNS: ED Triage Vitals  Enc Vitals Group     BP 11/15/19 1352 (!) 161/89     Pulse Rate 11/15/19 1352 (!) 112     Resp 11/15/19 1352 20     Temp 11/15/19 1352 98.9 F (37.2 C)     Temp Source 11/15/19 1352 Oral     SpO2 11/15/19 1352 97 %     Weight 11/15/19 1354 240 lb (108.9 kg)     Height 11/15/19 1354 5\' 7"  (1.702 m)     Head Circumference --      Peak Flow --      Pain Score 11/15/19 1354 0     Pain Loc --      Pain Edu? --      Excl. in [place]? --     Constitutional: Alert and oriented.  Yelling and screaming Eyes: Conjunctivae are normal. EOMI. Head: Atraumatic. Nose: No congestion/rhinnorhea. Mouth/Throat: Mucous membranes are moist.   Neck: No stridor. Trachea Midline. FROM Cardiovascular: Normal rate, regular rhythm. Grossly normal heart sounds.  Good peripheral circulation. Respiratory: Normal respiratory effort.  No retractions. Lungs CTAB. Gastrointestinal: Soft and nontender. No distention. No abdominal bruits.  Musculoskeletal: No lower extremity tenderness nor edema.  No joint effusions. Neurologic:  Normal speech and language. No gross focal neurologic deficits are appreciated.  Skin:  Skin is warm, dry and intact. No rash noted. Psychiatric: Agitated, positive paranoia positive hallucinations GU: Deferred   ____________________________________________   LABS (all labs ordered are listed, but only abnormal results are displayed)  Labs Reviewed  COMPREHENSIVE METABOLIC PANEL - Abnormal; Notable for the following components:      Result Value   CO2 20 (*)    Glucose, Bld 102 (*)    Total Protein 8.2 (*)    All other components within normal limits  ACETAMINOPHEN LEVEL - Abnormal; Notable for the following components:   Acetaminophen (Tylenol), Serum <10 (*)    All other components within normal limits  CBC - Abnormal;  Notable for the following components:   WBC 12.3 (*)    All other components within normal limits  URINE DRUG SCREEN, QUALITATIVE ([facility] ONLY) - Abnormal; Notable for the following components:   Tricyclic, Ur Screen POSITIVE (*)    Cannabinoid 50 Ng, Ur [place] POSITIVE (*)    Benzodiazepine, Ur Scrn POSITIVE (*)    All other components within normal limits  SARS CORONAVIRUS 2 (TAT 6-24 HRS)  ETHANOL  SALICYLATE LEVEL  POC URINE PREG, ED  POCT PREGNANCY, URINE   ____________________________________________    PROCEDURES  Procedure(s) performed (including Critical Care):  Procedures   ____________________________________________   INITIAL IMPRESSION / ASSESSMENT AND PLAN / ED COURSE  Insiya Oshea was evaluated in Emergency Department on 11/15/2019 for the symptoms described in the history of present illness. She was evaluated in the context of the [place] COVID-19  pandemic, which necessitated consideration that the patient might be at risk for infection with the SARS-CoV-2 virus that causes COVID-19. Institutional protocols and algorithms that pertain to the evaluation of patients at risk for COVID-19 are in a state of rapid change based on information released by regulatory bodies including the CDC and federal and state organizations. These policies and algorithms were followed during the patient's care in the ED.    Pt is without any acute medical complaints.  However she is very agitated yelling and screaming and talking about a computer that is inside of her.  Patient is under IVC by father for paranoia .will give 5 and 2 to help sedate patient due to her severe agitation not redirectable with verbal de-escalation.    Labs positive for benzos, THC, tricyclics.  No exam findings to suggest medical cause of current presentation. Will order psychiatric screening labs and discuss further w/ psychiatric service.  D/d includes but is not limited to psychiatric disease,  behavioral/personality disorder, inadequate socioeconomic support, medical.  Based on HPI, exam, unremarkable labs, no concern for acute medical problem at this time. No rigidity, clonus, hyperthermia, focal neurologic deficit, diaphoresis, tachycardia, meningismus, ataxia, gait abnormality or other finding to suggest this visit represents a non-psychiatric problem. Screening labs reviewed.    Given this, pt medically cleared, to be dispositioned per Psych.  ____________________________________________   FINAL CLINICAL IMPRESSION(S) / ED DIAGNOSES   Final diagnoses:  Paranoia (HCC)  Evaluation by psychiatric service required      MEDICATIONS GIVEN DURING THIS VISIT:  Medications  haloperidol lactate (HALDOL) injection 5 mg (5 mg Intramuscular Given 11/15/19 1503)  LORazepam (ATIVAN) injection 2 mg (2 mg Intramuscular Given 11/15/19 1505)     ED Discharge Orders    None       Note:  This document was prepared using Dragon voice recognition software and may include unintentional dictation errors.   Concha SeFunke, Avraj Lindroth E, MD 11/15/19 [phone]

## 2019-11-15 NOTE — ED Notes (Signed)
Cut on patient's finger cleaned with saline, bacitracin applied and covered with gauze.

## 2019-11-15 NOTE — ED Notes (Signed)
Belongings taken to [facility] with pt labels on it.

## 2019-11-15 NOTE — ED Notes (Signed)
Pt given an apple juice then container thrown away.

## 2019-11-15 NOTE — ED Triage Notes (Signed)
History of paranoid behavior as outlined in IVC papers. Patient states people can hear her thoughts. When questioned if anyone trying to harm her states they are trying to put her in hospital. Denies thoughts of harming self. Patient lives with dad who took out IVC.

## 2019-11-15 NOTE — ED Notes (Signed)
Hourly rounding reveals patient in day room. Stable, still responding to internal stimuli and paranoid, in no acute distress. Q15 minute rounds and monitoring via [employer] to continue.

## 2019-11-15 NOTE — ED Notes (Signed)
Pt given dinner tray.
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
## 2019-11-15 NOTE — ED Triage Notes (Signed)
Patient belongings as noted: mask, sweatshirt, 7 necklaces, one ring, one earring cannot come out, purse with medications, vitamins and cigarettes, one knife, one book, notes, bra, sweatpants, leggings, shoes socks.

## 2019-11-15 NOTE — ED Notes (Signed)
Report to include Situation, Background, Assessment, and Recommendations received from Amy B. RN. Patient alert and oriented, warm and dry, in no acute distress. Patient denies SI, HI, VH and pain. Patient states she hears "voices trying to manipulate me". Patient made aware of Q15 minute rounds and security cameras for their safety. Patient instructed to come to me with needs or concerns.

## 2019-11-15 NOTE — ED Notes (Signed)
Pt asking to see her mothers necklaces to be sure they were safe. This Probation officer and Janett Billow, RN showed patient the bag with necklaces through the sally port door. Necklaces returned to patient's purse in belongings bag and locked back in [place].  Pt asked to have a book from her belongings. Unable to give the patient the book due to it having a hard cover.

## 2019-11-16 MED ORDER — METFORMIN HCL 500 MG PO TABS
500.0000 mg | ORAL_TABLET | Freq: Every day | ORAL | Status: DC
  Administered 2019-11-17: 500 mg via ORAL
  Filled 2019-11-16 (×2): qty 1

## 2019-11-16 MED ORDER — DIPHENHYDRAMINE HCL 50 MG/ML IJ SOLN
50.0000 mg | Freq: Once | INTRAMUSCULAR | Status: AC
  Administered 2019-11-16: 50 mg via INTRAMUSCULAR

## 2019-11-16 MED ORDER — DROPERIDOL 2.5 MG/ML IJ SOLN
5.0000 mg | Freq: Once | INTRAMUSCULAR | Status: AC
  Administered 2019-11-16: 5 mg via INTRAMUSCULAR
  Filled 2019-11-16 (×2): qty 2

## 2019-11-16 MED ORDER — METFORMIN HCL 500 MG PO TABS: 500.0000 mg | ORAL_TABLET | ORAL | Status: DC

## 2019-11-16 MED ORDER — FENOFIBRATE 54 MG PO TABS
54.0000 mg | ORAL_TABLET | Freq: Every day | ORAL | Status: DC
  Administered 2019-11-16 – 2019-11-18 (×2): 54 mg via ORAL
  Filled 2019-11-16 (×3): qty 1

## 2019-11-16 MED ORDER — PRAZOSIN HCL 2 MG PO CAPS
2.0000 mg | ORAL_CAPSULE | Freq: Two times a day (BID) | ORAL | Status: DC
  Administered 2019-11-16 – 2019-11-18 (×5): 2 mg via ORAL
  Filled 2019-11-16 (×6): qty 1

## 2019-11-16 MED ORDER — IMIPRAMINE HCL 50 MG PO TABS
50.0000 mg | ORAL_TABLET | Freq: Every day | ORAL | Status: DC
  Filled 2019-11-16: qty 2

## 2019-11-16 MED ORDER — METFORMIN HCL 500 MG PO TABS
1000.0000 mg | ORAL_TABLET | Freq: Every day | ORAL | Status: DC
  Administered 2019-11-16 – 2019-11-17 (×2): 1000 mg via ORAL
  Filled 2019-11-16 (×2): qty 2

## 2019-11-16 MED ORDER — LORAZEPAM 2 MG/ML IJ SOLN
2.0000 mg | Freq: Once | INTRAMUSCULAR | Status: AC
  Administered 2019-11-16: 18:00:00 2 mg via INTRAVENOUS

## 2019-11-16 MED ORDER — CARIPRAZINE HCL 3 MG PO CAPS
6.0000 mg | ORAL_CAPSULE | Freq: Every day | ORAL | Status: DC
  Administered 2019-11-16 – 2019-11-18 (×3): 6 mg via ORAL
  Filled 2019-11-16 (×3): qty 2

## 2019-11-16 MED ORDER — BUPROPION HCL ER (XL) 150 MG PO TB24
150.0000 mg | ORAL_TABLET | Freq: Every day | ORAL | Status: DC
  Administered 2019-11-16 – 2019-11-18 (×3): 150 mg via ORAL
  Filled 2019-11-16 (×3): qty 1

## 2019-11-16 MED ORDER — DIPHENHYDRAMINE HCL 50 MG/ML IJ SOLN
INTRAMUSCULAR | Status: AC
  Filled 2019-11-16: qty 1

## 2019-11-16 MED ORDER — PALIPERIDONE ER 3 MG PO TB24
6.0000 mg | ORAL_TABLET | Freq: Every day | ORAL | Status: DC
  Administered 2019-11-16 – 2019-11-18 (×3): 6 mg via ORAL
  Filled 2019-11-16 (×3): qty 2

## 2019-11-16 MED ORDER — IMIPRAMINE HCL 50 MG PO TABS
50.0000 mg | ORAL_TABLET | Freq: Every day | ORAL | Status: DC
  Administered 2019-11-16 – 2019-11-17 (×2): 50 mg via ORAL
  Filled 2019-11-16: qty 1

## 2019-11-16 MED ORDER — LORAZEPAM 2 MG/ML IJ SOLN
INTRAMUSCULAR | Status: AC
  Administered 2019-11-16: 2 mg via INTRAMUSCULAR
  Filled 2019-11-16: qty 1

## 2019-11-16 MED ORDER — IMIPRAMINE HCL 50 MG PO TABS
50.0000 mg | ORAL_TABLET | Freq: Every evening | ORAL | Status: DC | PRN
  Filled 2019-11-16: qty 1

## 2019-11-16 MED ORDER — TOPIRAMATE 25 MG PO TABS
50.0000 mg | ORAL_TABLET | Freq: Two times a day (BID) | ORAL | Status: DC
  Administered 2019-11-16 – 2019-11-18 (×4): 50 mg via ORAL
  Filled 2019-11-16 (×6): qty 2

## 2019-11-16 NOTE — ED Notes (Signed)
Pt becoming aggressive and threatening towards another patient.  EDP made aware. Security officers and [employer]  present on unit. IM medication ordered and administered with assistance of security and [employer].

## 2019-11-16 NOTE — ED Notes (Signed)
Pt requesting additional home medications. Pharmacy tech made aware and will come to unit to speak with patient.

## 2019-11-16 NOTE — ED Notes (Signed)
Hourly rounding reveals patient sleeping in room. No complaints, stable, in no acute distress. Q15 minute rounds and monitoring via [employer] Cam[employer] to continue. 

## 2019-11-16 NOTE — ED Notes (Signed)
Pt. In day room talking loudly to nursing staff despite redirection.
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
## 2019-11-16 NOTE — ED Notes (Addendum)
Pt sitting in day room given a cup of water.

## 2019-11-16 NOTE — ED Notes (Signed)
Hourly rounding reveals patient in day room. No complaints, stable, in no acute distress. Q15 minute rounds and monitoring via Security Cameras to continue. 
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
## 2019-11-16 NOTE — ED Notes (Signed)
Hourly rounding reveals patient awake in room. No complaints, stable, in no acute distress. Q15 minute rounds and monitoring via Security Cameras to continue. 

## 2019-11-16 NOTE — ED Notes (Signed)
Pt given lunch tray and juice. 

## 2019-11-16 NOTE — ED Notes (Signed)
Pt given water and graham crackers 

## 2019-11-16 NOTE — ED Provider Notes (Signed)
-----------------------------------------   6:49 AM on 11/16/2019 -----------------------------------------   Blood pressure (!) 161/89, pulse (!) 112, temperature 98.9 F (37.2 C), temperature source Oral, resp. rate 20, height 5\' 7"  (1.702 m), weight 108.9 kg, last menstrual period 11/01/2019, SpO2 97 %.  The patient is sleeping at this time.  There have been no acute events since the last update.  Awaiting disposition plan from Behavioral Medicine and/or Social Work team(s).   Paulette Blanch, MD 11/16/19 [phone]

## 2019-11-16 NOTE — ED Notes (Signed)
Report to include Situation, Background, Assessment, and Recommendations received from Amy B. RN. Patient alert and oriented, warm and dry, in no acute distress. Patient denies SI, HI, VH and pain. States she is hearing "several peoples voices" without command. Patient made aware of Q15 minute rounds and security cameras for their safety. Patient instructed to come to me with needs or concerns.

## 2019-11-16 NOTE — ED Notes (Signed)
TTS speaking with patient in dayroom. Maintained on 15 minute checks and observation by security camera for safety.

## 2019-11-16 NOTE — ED Notes (Signed)
Pt refused a shower. "I want to take a shower at home."

## 2019-11-16 NOTE — ED Notes (Signed)
Pt kneeling on the floor in rhe dayroom praying.

## 2019-11-16 NOTE — ED Notes (Signed)
While escorting patient back into the behavioral medical unit. This Tech noticed [place] [employer] officer's badge in patients left pant pocket. This tech removed the officer's badge from the patients pocket, and handed badge to the police officer before the patient entered the BMU.

## 2019-11-16 NOTE — ED Notes (Addendum)
Pt screaming in dayroom about sound proof rooms and her facebook page.  Pt appears to be responding to internal stimuli speaking to and about people that are not present.  "Arvil Chaco is a Control and instrumentation engineer."
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
## 2019-11-16 NOTE — ED Notes (Signed)
Pt call light in room remains going off but due to behavior it is unsafe to turn off. Amy RN aware.

## 2019-11-16 NOTE — ED Notes (Addendum)
Pharmacy tech to unit to look through/ document the home medications in patient's  belongings.  All medications returned to patient's purse and locked up.

## 2019-11-16 NOTE — BH Assessment (Signed)
Spoke with Ethelda Chick at [facility] regarding placement; [facility] will review pt's chart and TTS will call back this afternoon for an update

## 2019-11-17 MED ORDER — CYCLOBENZAPRINE HCL 10 MG PO TABS
10.0000 mg | ORAL_TABLET | Freq: Once | ORAL | Status: AC
  Administered 2019-11-17: 23:00:00 10 mg via ORAL
  Filled 2019-11-17: qty 1

## 2019-11-17 MED ORDER — HALOPERIDOL LACTATE 5 MG/ML IJ SOLN
5.0000 mg | Freq: Once | INTRAMUSCULAR | Status: AC
  Administered 2019-11-17: 13:00:00 5 mg via INTRAMUSCULAR
  Filled 2019-11-17: qty 1

## 2019-11-17 MED ORDER — CYCLOBENZAPRINE HCL 10 MG PO TABS
10.0000 mg | ORAL_TABLET | Freq: Once | ORAL | Status: AC
  Administered 2019-11-17: 10 mg via ORAL
  Filled 2019-11-17: qty 1

## 2019-11-17 MED ORDER — IBUPROFEN 600 MG PO TABS
600.0000 mg | ORAL_TABLET | Freq: Once | ORAL | Status: AC
  Administered 2019-11-17: 600 mg via ORAL
  Filled 2019-11-17: qty 1

## 2019-11-17 MED ORDER — LORAZEPAM 2 MG/ML IJ SOLN: 2.0000 mg | INTRAMUSCULAR | Status: DC | PRN

## 2019-11-17 MED ORDER — LORAZEPAM 2 MG/ML IJ SOLN
2.0000 mg | Freq: Once | INTRAMUSCULAR | Status: AC
  Filled 2019-11-17: qty 1

## 2019-11-17 MED ORDER — NICOTINE 21 MG/24HR TD PT24
21.0000 mg | MEDICATED_PATCH | Freq: Every day | TRANSDERMAL | Status: DC
  Administered 2019-11-17: 21:00:00 21 mg via TRANSDERMAL
  Filled 2019-11-17 (×2): qty 1

## 2019-11-17 MED ORDER — LORAZEPAM 2 MG PO TABS: 2.0000 mg | ORAL_TABLET | ORAL | Status: DC | PRN

## 2019-11-17 MED ORDER — IBUPROFEN 800 MG PO TABS
400.0000 mg | ORAL_TABLET | Freq: Four times a day (QID) | ORAL | Status: DC | PRN
  Administered 2019-11-18: 400 mg via ORAL
  Filled 2019-11-17: qty 1

## 2019-11-17 NOTE — ED Notes (Signed)
Hourly rounding reveals patient awake in room. No complaints, stable, in no acute distress. Q15 minute rounds and monitoring via Security Cameras to continue. 

## 2019-11-17 NOTE — ED Notes (Signed)
Hourly rounding reveals patient sleeping in room. No complaints, stable, in no acute distress. Q15 minute rounds and monitoring via [employer] Cam[employer] to continue. 

## 2019-11-17 NOTE — ED Notes (Signed)
Hourly rounding reveals patient in room. No complaints, stable, in no acute distress. Q15 minute rounds and monitoring via [employer] to continue. 

## 2019-11-17 NOTE — ED Notes (Signed)
Hourly rounding reveals patient in hall. No complaints, stable, in no acute distress. Q15 minute rounds and monitoring via Security Cameras to continue. 

## 2019-11-17 NOTE — ED Notes (Signed)
Patient given Ativan 2 IM and Haldol 5 IM due to aggression towards self, patient  was slamming herself against her bedroom door, and telling out "I can hear you" not sure who she was talking to there was no else on the floor.

## 2019-11-17 NOTE — ED Notes (Signed)
Patient awake in day room.

## 2019-11-17 NOTE — ED Notes (Signed)
BEHAVIORAL HEALTH ROUNDING Patient sleeping: No. Patient alert and oriented: yes Behavior appropriate: Yes.  ; If no, describe:  Nutrition and fluids offered: yes Toileting and hygiene offered: Yes  Sitter present: q15 minute observations and security monitoring Law enforcement present: Yes    

## 2019-11-17 NOTE — ED Notes (Signed)
Hourly rounding reveals patient awake in room. Stable, in no acute distress. Q15 minute rounds and monitoring via Security Cameras to continue. 

## 2019-11-17 NOTE — ED Notes (Addendum)
Writer noticed that patient had some bruising on her knuckles, due to her punching her bedroom door. Patient has small bruises on her right knuckles, with some slight swelling.   MD Archie Balboa notified

## 2019-11-17 NOTE — ED Notes (Signed)
Patient given breakfast tray and eating breakfast

## 2019-11-17 NOTE — ED Notes (Signed)
Patient is hollering out her door, yelling and screaming. Patient sounds like she is talking to her father, she keeps opening her door and slamming it. She also keeps turning her lights on and off, patient is hearing voices and talking to people that arent there.

## 2019-11-17 NOTE — ED Notes (Signed)
Hourly rounding reveals patient in day room. No complaints, stable, in no acute distress. Q15 minute rounds and monitoring via Security Cameras to continue. 

## 2019-11-17 NOTE — ED Provider Notes (Signed)
-----------------------------------------   9:28 AM on 11/17/2019 -----------------------------------------   Blood pressure 128/84, pulse 98, temperature 98.3 F (36.8 C), temperature source Oral, resp. rate 16, height 5\' 7"  (1.702 m), weight 108.9 kg, last menstrual period 11/01/2019, SpO2 100 %.  The patient is calm and cooperative at this time.  There have been no acute events since the last update.  Awaiting disposition plan from Behavioral Medicine and/or Social Work team(s).    Nena Polio, MD 11/17/19 [phone]

## 2019-11-17 NOTE — ED Notes (Signed)
Report to include situation, background, assessment and recommendations from Jadeka RN. Patient sleeping, respirations regular and unlabored. Q15 minute rounds and security camera observation to continue.   

## 2019-11-18 ENCOUNTER — Inpatient Hospital Stay (HOSPITAL_COMMUNITY)
Admission: AD | Admit: 2019-11-18 | Discharge: 2019-11-21 | DRG: 885 | Disposition: A | Discharge status: Home / Self Care | Payer: Medicaid Other | Attending: Psychiatry | Admitting: Psychiatry

## 2019-11-18 ENCOUNTER — Other Ambulatory Visit: Payer: Self-pay

## 2019-11-18 ENCOUNTER — Encounter (HOSPITAL_COMMUNITY): Payer: Self-pay | Admitting: Psychiatry

## 2019-11-18 DIAGNOSIS — F419 Anxiety disorder, unspecified: Secondary | ICD-10-CM | POA: Diagnosis present

## 2019-11-18 DIAGNOSIS — F17210 Nicotine dependence, cigarettes, uncomplicated: Secondary | ICD-10-CM | POA: Diagnosis present

## 2019-11-18 DIAGNOSIS — F1411 Cocaine abuse, in remission: Secondary | ICD-10-CM | POA: Diagnosis present

## 2019-11-18 DIAGNOSIS — F312 Bipolar disorder, current episode manic severe with psychotic features: Principal | ICD-10-CM

## 2019-11-18 DIAGNOSIS — R440 Auditory hallucinations: Secondary | ICD-10-CM | POA: Diagnosis present

## 2019-11-18 DIAGNOSIS — F29 Unspecified psychosis not due to a substance or known physiological condition: Secondary | ICD-10-CM | POA: Diagnosis present

## 2019-11-18 DIAGNOSIS — F3164 Bipolar disorder, current episode mixed, severe, with psychotic features: Secondary | ICD-10-CM | POA: Diagnosis not present

## 2019-11-18 LAB — RESPIRATORY PANEL BY RT PCR (FLU A&B, COVID)
Influenza A by PCR: NEGATIVE
Influenza B by PCR: NEGATIVE
SARS Coronavirus 2 by RT PCR: NEGATIVE

## 2019-11-18 MED ORDER — BUPROPION HCL ER (XL) 150 MG PO TB24
150.0000 mg | ORAL_TABLET | Freq: Every day | ORAL | Status: DC
  Filled 2019-11-18 (×2): qty 1

## 2019-11-18 MED ORDER — CARIPRAZINE HCL 1.5 MG PO CAPS
6.0000 mg | ORAL_CAPSULE | Freq: Every day | ORAL | Status: DC
  Filled 2019-11-18 (×2): qty 4

## 2019-11-18 MED ORDER — IMIPRAMINE HCL 25 MG PO TABS: 50.0000 mg | ORAL_TABLET | Freq: Every evening | ORAL | Status: DC | PRN

## 2019-11-18 MED ORDER — NORETHIN-ETH ESTRAD TRIPHASIC 0.5/0.75/1-35 MG-MCG PO TABS: 1.0000 | ORAL_TABLET | Freq: Every day | ORAL | Status: DC

## 2019-11-18 MED ORDER — HYDROXYZINE HCL 25 MG PO TABS
25.0000 mg | ORAL_TABLET | Freq: Once | ORAL | Status: AC
  Administered 2019-11-19: 25 mg via ORAL
  Filled 2019-11-18 (×2): qty 1

## 2019-11-18 MED ORDER — FENOFIBRATE 54 MG PO TABS
54.0000 mg | ORAL_TABLET | Freq: Every day | ORAL | Status: DC
  Administered 2019-11-19 – 2019-11-21 (×3): 54 mg via ORAL
  Filled 2019-11-18 (×5): qty 1

## 2019-11-18 MED ORDER — PALIPERIDONE ER 6 MG PO TB24
6.0000 mg | ORAL_TABLET | Freq: Every day | ORAL | Status: DC
  Filled 2019-11-18 (×2): qty 1

## 2019-11-18 MED ORDER — ZIPRASIDONE HCL 60 MG PO CAPS
60.0000 mg | ORAL_CAPSULE | Freq: Two times a day (BID) | ORAL | Status: DC
  Filled 2019-11-18 (×3): qty 1

## 2019-11-18 MED ORDER — BACITRACIN ZINC 500 UNIT/GM EX OINT
TOPICAL_OINTMENT | Freq: Every day | CUTANEOUS | Status: DC
  Administered 2019-11-20: 1 via TOPICAL
  Filled 2019-11-18: qty 28.35

## 2019-11-18 MED ORDER — GABAPENTIN 100 MG PO CAPS
100.0000 mg | ORAL_CAPSULE | Freq: Three times a day (TID) | ORAL | Status: DC
  Filled 2019-11-18 (×4): qty 1

## 2019-11-18 MED ORDER — BUSPIRONE HCL 15 MG PO TABS
15.0000 mg | ORAL_TABLET | Freq: Three times a day (TID) | ORAL | Status: DC
  Administered 2019-11-19 – 2019-11-21 (×7): 15 mg via ORAL
  Filled 2019-11-18 (×13): qty 1

## 2019-11-18 MED ORDER — NICOTINE POLACRILEX 2 MG MT GUM
CHEWING_GUM | OROMUCOSAL | Status: AC
  Filled 2019-11-18: qty 1

## 2019-11-18 MED ORDER — METFORMIN HCL 500 MG PO TABS
500.0000 mg | ORAL_TABLET | Freq: Every day | ORAL | Status: DC
  Administered 2019-11-19 – 2019-11-21 (×3): 500 mg via ORAL
  Filled 2019-11-18 (×6): qty 1

## 2019-11-18 MED ORDER — METFORMIN HCL 500 MG PO TABS
1000.0000 mg | ORAL_TABLET | Freq: Every day | ORAL | Status: DC
  Administered 2019-11-19 – 2019-11-20 (×2): 1000 mg via ORAL
  Filled 2019-11-18 (×4): qty 2

## 2019-11-18 MED ORDER — IMIPRAMINE HCL 25 MG PO TABS
50.0000 mg | ORAL_TABLET | Freq: Every day | ORAL | Status: DC
  Administered 2019-11-18: 50 mg via ORAL
  Filled 2019-11-18 (×3): qty 2

## 2019-11-18 MED ORDER — CYCLOBENZAPRINE HCL 10 MG PO TABS
10.0000 mg | ORAL_TABLET | Freq: Once | ORAL | Status: AC
  Administered 2019-11-18: 10 mg via ORAL
  Filled 2019-11-18 (×2): qty 1

## 2019-11-18 MED ORDER — TOPIRAMATE 25 MG PO TABS
50.0000 mg | ORAL_TABLET | Freq: Two times a day (BID) | ORAL | Status: DC
  Administered 2019-11-18: 50 mg via ORAL
  Filled 2019-11-18 (×5): qty 2

## 2019-11-18 MED ORDER — PRAZOSIN HCL 1 MG PO CAPS
2.0000 mg | ORAL_CAPSULE | Freq: Two times a day (BID) | ORAL | Status: DC
  Administered 2019-11-18 – 2019-11-21 (×6): 2 mg via ORAL
  Filled 2019-11-18 (×11): qty 2

## 2019-11-18 MED ORDER — NICOTINE 21 MG/24HR TD PT24
21.0000 mg | MEDICATED_PATCH | Freq: Every day | TRANSDERMAL | Status: DC
  Administered 2019-11-19: 21 mg via TRANSDERMAL
  Filled 2019-11-18 (×3): qty 1

## 2019-11-18 NOTE — BH Assessment (Signed)
Patient is being reviewed by [facility] - they request that pt have a REPEAT COVID-19 testing.   Last COVID test collected 11/15/2019
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
## 2019-11-18 NOTE — Progress Notes (Signed)
Psychoeducational Group Note  Date:  11/18/2019 Time:  2031  Group Topic/Focus:  Wrap-Up Group:   The focus of this group is to help patients review their daily goal of treatment and discuss progress on daily workbooks.  Participation Level: Did Not Attend  Participation Quality:  Not Applicable  Affect:  Not Applicable  Cognitive:  Not Applicable  Insight:  Not Applicable  Engagement in Group: Not Applicable  Additional Comments: The patient did not attend group this evening since she was having her admission completed by the nurse.   Archie Balboa S 11/18/2019, 8:31 PM

## 2019-11-18 NOTE — ED Notes (Signed)
Hourly rounding reveals patient sleeping in room. No complaints, stable, in no acute distress. Q15 minute rounds and monitoring via [employer] Cam[employer] to continue. 

## 2019-11-18 NOTE — ED Notes (Signed)
Hourly rounding reveals patient awake in room. No complaints, stable, in no acute distress. Q15 minute rounds and monitoring via Security Cameras to continue. 

## 2019-11-18 NOTE — Tx Team (Signed)
Initial Treatment Plan 11/18/2019 9:40 PM [name] Record [id number]    PATIENT STRESSORS: Marital or family conflict Medication change or noncompliance   PATIENT STRENGTHS: Ability for insight General fund of knowledge Motivation for treatment/growth   PATIENT IDENTIFIED PROBLEMS:  paranoia   Medication adjustment  "brainstorming how to treat family and friends and self better self worth"                 DISCHARGE CRITERIA:  Improved stabilization in mood, thinking, and/or behavior Verbal commitment to aftercare and medication compliance  PRELIMINARY DISCHARGE PLAN: Attend aftercare/continuing care group Outpatient therapy Participate in family therapy  PATIENT/FAMILY INVOLVEMENT: This treatment plan has been presented to and reviewed with the patient, Alyssa Allen.  The patient and family have been given the opportunity to ask questions and make suggestions.  Providence Crosby, RN 11/18/2019, 9:40 PM

## 2019-11-18 NOTE — ED Notes (Signed)
Patient refused afternoon medications because she was upset that she had to leave.

## 2019-11-18 NOTE — ED Notes (Signed)
Patient transferred to [facility], patient and sheriff received transfer papers. Patient received belongings and verbalized she has received all of her belongings. Patient appropriate and cooperative, Denies SI/HI AVH. Vital signs taken. NAD noted.
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
## 2019-11-18 NOTE — ED Notes (Signed)
Patient is on the phone with dad and asking when can she come back home she says she is ready to be picked up. Staff informed patient that she will be admitted

## 2019-11-18 NOTE — ED Notes (Signed)
SHERIFF  DEPT  CALLED  FOR TRANSFER TO  [facility]

## 2019-11-18 NOTE — BH Assessment (Signed)
Patient has been accepted to [facility].  Patient assigned to room 501-2 Accepting physician is Dr. Sheppard Evens.  Call report to [phone].  Representative was Ava.   ER Staff is aware of it:  Lattie Haw, ER Secretary  Dr. Jari Pigg, ER MD  Donneta Romberg, Patient's Nurse     Patient's Family/Support System [place] M[place]ade: [phone]) have been updated as well.

## 2019-11-18 NOTE — ED Notes (Signed)
Hourly rounding reveals patient in day room. No complaints, stable, in no acute distress. Q15 minute rounds and monitoring via Security Cameras to continue. 

## 2019-11-19 LAB — HEMOGLOBIN A1C
Hgb A1c MFr Bld: 5.3 % (ref 4.8–5.6)
Mean Plasma Glucose: 105.41 mg/dL

## 2019-11-19 LAB — LIPID PANEL
Cholesterol: 179 mg/dL (ref 0–200)
HDL: 47 mg/dL (ref >40)
LDL Cholesterol: 108 mg/dL — ABNORMAL HIGH (ref 0–99)
Total CHOL/HDL Ratio: 3.8 RATIO
Triglycerides: 118 mg/dL (ref <150)
VLDL: 24 mg/dL (ref 0–40)

## 2019-11-19 LAB — TSH: TSH: 1.808 u[IU]/mL (ref 0.350–4.500)

## 2019-11-19 MED ORDER — IBUPROFEN 800 MG PO TABS
800.0000 mg | ORAL_TABLET | Freq: Once | ORAL | Status: AC
  Administered 2019-11-19: 800 mg via ORAL
  Filled 2019-11-19 (×2): qty 1

## 2019-11-19 MED ORDER — HALOPERIDOL 5 MG PO TABS
15.0000 mg | ORAL_TABLET | Freq: Every day | ORAL | Status: DC
  Administered 2019-11-19 – 2019-11-20 (×2): 15 mg via ORAL
  Filled 2019-11-19 (×4): qty 3

## 2019-11-19 MED ORDER — NICOTINE 21 MG/24HR TD PT24
21.0000 mg | MEDICATED_PATCH | Freq: Every day | TRANSDERMAL | Status: DC
  Administered 2019-11-20 – 2019-11-21 (×2): 21 mg via TRANSDERMAL
  Filled 2019-11-19 (×4): qty 1

## 2019-11-19 MED ORDER — CARBAMAZEPINE 100 MG PO CHEW
100.0000 mg | CHEWABLE_TABLET | Freq: Three times a day (TID) | ORAL | Status: DC
  Administered 2019-11-19 – 2019-11-21 (×7): 100 mg via ORAL
  Filled 2019-11-19 (×14): qty 1

## 2019-11-19 MED ORDER — BENZTROPINE MESYLATE 0.5 MG PO TABS
0.5000 mg | ORAL_TABLET | Freq: Two times a day (BID) | ORAL | Status: DC
  Administered 2019-11-19 – 2019-11-21 (×5): 0.5 mg via ORAL
  Filled 2019-11-19 (×10): qty 1

## 2019-11-19 MED ORDER — CLONAZEPAM 1 MG PO TABS
1.0000 mg | ORAL_TABLET | Freq: Three times a day (TID) | ORAL | Status: DC
  Administered 2019-11-19 – 2019-11-21 (×7): 1 mg via ORAL
  Filled 2019-11-19 (×7): qty 1

## 2019-11-19 MED ORDER — GABAPENTIN 300 MG PO CAPS
300.0000 mg | ORAL_CAPSULE | Freq: Three times a day (TID) | ORAL | Status: DC
  Administered 2019-11-19 – 2019-11-21 (×7): 300 mg via ORAL
  Filled 2019-11-19 (×14): qty 1

## 2019-11-19 MED ORDER — TEMAZEPAM 30 MG PO CAPS
30.0000 mg | ORAL_CAPSULE | Freq: Every evening | ORAL | Status: DC | PRN
  Administered 2019-11-20: 30 mg via ORAL
  Filled 2019-11-19: qty 1

## 2019-11-19 MED ORDER — HALOPERIDOL 5 MG PO TABS
5.0000 mg | ORAL_TABLET | Freq: Every day | ORAL | Status: DC
  Administered 2019-11-19 – 2019-11-20 (×2): 5 mg via ORAL
  Filled 2019-11-19 (×4): qty 1

## 2019-11-19 NOTE — Progress Notes (Signed)
   11/19/19 2100  Psych Admission Type (Psych Patients Only)  Admission Status Involuntary  Psychosocial Assessment  Patient Complaints Worrying  Eye Contact Fair  Facial Expression Anxious  Affect Appropriate to circumstance  Speech Logical/coherent  Interaction Assertive  Motor Activity Slow  Appearance/Hygiene Disheveled  Behavior Characteristics Cooperative  Mood Depressed  Thought Process  Coherency Circumstantial  Content Paranoia  Delusions WDL  Perception WDL  Hallucination None reported or observed  Judgment Impaired  Confusion None  Danger to Self  Current suicidal ideation? Denies  Danger to Others  Danger to Others None reported or observed

## 2019-11-19 NOTE — Progress Notes (Signed)
Recreation Therapy Notes  Date: 12.16.20 Time: 1000 Location: 500 Hall Dayroom    Group Topic: Communication, Team Building, Problem Solving  Goal Area(s) Addresses:  Patient will effectively work with peer towards shared goal.  Patient will identify skills used to make activity successful.  Patient will identify how skills used during activity can be used to reach post d/c goals.   Behavioral Response: Engaged  Intervention: STEM Activity  Activity: Straw Bridge. In teams patients were given 15 plastic drinking straws and a length of masking tape. Using the materials provided patients were asked to build an elevated bridge that can hold a small puzzle box without collapsing.   Education: Education officer, community, Discharge Planning   Education Outcome: Acknowledges education/In group clarification offered/Needs additional education.   Clinical Observations/Feedback: Pt was engaged and worked well with peers in coming up with a concept for their bridge.  Pt expressed the group had to use persistence and perspective.  Pt explained perspective works with your support system by getting everyone's ideas on how come up with and accomplish the goal.     Victorino Sparrow, LRT/CTRS    Victorino Sparrow A 11/19/2019 11:23 AM
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
## 2019-11-19 NOTE — Tx Team (Signed)
Interdisciplinary Treatment and Diagnostic Plan Update  11/19/2019 Time of Session: 10:00am [facility] MRN: [id number]  Principal Diagnosis: <principal problem not specified>  Secondary Diagnoses: Active Problems:   Psychosis ([place])   Current Medications:  Current Facility-Administered Medications  Medication Dose Route Frequency Provider Last Rate Last Admin  . bacitracin ointment   Topical Daily Mordecai Maes, NP   Given at 11/19/19 [phone]  . benztropine (COGENTIN) tablet 0.5 mg  0.5 mg Oral BID Johnn Hai, MD   0.5 mg at 11/19/19 0815  . busPIRone (BUSPAR) tablet 15 mg  15 mg Oral TID Mordecai Maes, NP   15 mg at 11/19/19 0811  . carbamazepine (TEGRETOL) chewable tablet 100 mg  100 mg Oral TID Johnn Hai, MD   100 mg at 11/19/19 0815  . clonazePAM (KLONOPIN) tablet 1 mg  1 mg Oral TID Johnn Hai, MD   1 mg at 11/19/19 0815  . fenofibrate tablet 54 mg  54 mg Oral Daily Mordecai Maes, NP   54 mg at 11/19/19 0810  . gabapentin (NEURONTIN) capsule 300 mg  300 mg Oral TID Johnn Hai, MD   300 mg at 11/19/19 0815  . haloperidol (HALDOL) tablet 15 mg  15 mg Oral QHS Johnn Hai, MD      . haloperidol (HALDOL) tablet 5 mg  5 mg Oral Q1200 Johnn Hai, MD      . hydrOXYzine (ATARAX/VISTARIL) tablet 25 mg  25 mg Oral Once Anike, Adaku C, NP      . metFORMIN (GLUCOPHAGE) tablet 1,000 mg  1,000 mg Oral Q supper Mordecai Maes, NP      . metFORMIN (GLUCOPHAGE) tablet 500 mg  500 mg Oral Q breakfast Mordecai Maes, NP   500 mg at 11/19/19 0811  . nicotine (NICODERM CQ - dosed in mg/24 hours) patch 21 mg  21 mg Transdermal Daily Mordecai Maes, NP   21 mg at 11/19/19 0817  . norethindrone-ethinyl estradiol (CYCLAFEM) tablet 1 tablet  1 tablet Oral Daily Mordecai Maes, NP      . prazosin (MINIPRESS) capsule 2 mg  2 mg Oral BID Mordecai Maes, NP   2 mg at 11/19/19 0810  . temazepam (RESTORIL) capsule 30 mg  30 mg Oral QHS PRN Johnn Hai, MD       PTA  Medications: Medications Prior to Admission  Medication Sig Dispense Refill Last Dose  . ALPRAZolam (XANAX XR) 2 MG 24 hr tablet Take 2 mg by mouth 2 (two) times daily.     Marland Kitchen buPROPion (WELLBUTRIN XL) 150 MG 24 hr tablet Take 150 mg by mouth daily.     . busPIRone (BUSPAR) 30 MG tablet Take 30 mg by mouth 2 (two) times daily.   0   . cyclobenzaprine (FLEXERIL) 10 MG tablet Take 5-10 mg by mouth 3 (three) times daily as needed for muscle spasms.     Marland Kitchen escitalopram (LEXAPRO) 20 MG tablet Take 20 mg by mouth daily.      . fenofibrate (TRICOR) 48 MG tablet Take 48 mg by mouth daily.     Marland Kitchen gabapentin (NEURONTIN) 300 MG capsule Take 600 mg by mouth 3 (three) times daily.     Marland Kitchen ibuprofen (ADVIL) 800 MG tablet TAKE 1 TABLET BY MOUTH EVERY 8 HOURS AS NEEDED (Patient taking differently: Take 800 mg by mouth 3 (three) times daily. ) 90 tablet 0   . imipramine (TOFRANIL) 50 MG tablet Take 50-100 mg by mouth at bedtime.     . metFORMIN (GLUCOPHAGE) 500  MG tablet Take 500-1,000 mg by mouth See admin instructions. Take 1 tablet ('500mg'$ ) by mouth every morning and take 2 tablets ('1000mg'$ ) by mouth every evening     . norethindrone-ethinyl estradiol (CYCLAFEM,ALYACEN) 0.5/0.75/1-35 MG-MCG tablet Take by mouth.     . ondansetron (ZOFRAN) 8 MG tablet Take 4-8 mg by mouth daily.     . paliperidone (INVEGA) 6 MG 24 hr tablet Take 6 mg by mouth daily.     . prazosin (MINIPRESS) 2 MG capsule Take 2 mg by mouth 2 (two) times daily.     Marland Kitchen topiramate (TOPAMAX) 50 MG tablet Take 50 mg by mouth 2 (two) times daily.     Marland Kitchen VRAYLAR 6 MG CAPS Take 6 mg by mouth daily.     . ziprasidone (GEODON) 80 MG capsule Take 80 mg by mouth at bedtime.        Patient Stressors: Marital or family conflict Medication change or noncompliance  Patient Strengths: Ability for insight General fund of knowledge Motivation for treatment/growth  Treatment Modalities: Medication Management, Group therapy, Case management,  1 to 1 session with  clinician, Psychoeducation, Recreational therapy.   Physician Treatment Plan for Primary Diagnosis: <principal problem not specified> Long Term Goal(s): Improvement in symptoms so as ready for discharge Improvement in symptoms so as ready for discharge   Short Term Goals: Ability to identify changes in lifestyle to reduce recurrence of condition will improve Ability to verbalize feelings will improve Ability to disclose and discuss suicidal ideas Ability to demonstrate self-control will improve Ability to identify and develop effective coping behaviors will improve  Medication Management: Evaluate patient's response, side effects, and tolerance of medication regimen.  Therapeutic Interventions: 1 to 1 sessions, Unit Group sessions and Medication administration.  Evaluation of Outcomes: Not Met  Physician Treatment Plan for Secondary Diagnosis: Active Problems:   Psychosis ([place])  Long Term Goal(s): Improvement in symptoms so as ready for discharge Improvement in symptoms so as ready for discharge   Short Term Goals: Ability to identify changes in lifestyle to reduce recurrence of condition will improve Ability to verbalize feelings will improve Ability to disclose and discuss suicidal ideas Ability to demonstrate self-control will improve Ability to identify and develop effective coping behaviors will improve     Medication Management: Evaluate patient's response, side effects, and tolerance of medication regimen.  Therapeutic Interventions: 1 to 1 sessions, Unit Group sessions and Medication administration.  Evaluation of Outcomes: Not Met   RN Treatment Plan for Primary Diagnosis: <principal problem not specified> Long Term Goal(s): Knowledge of disease and therapeutic regimen to maintain health will improve  Short Term Goals: Ability to participate in decision making will improve, Ability to verbalize feelings will improve and Ability to identify and develop effective  coping behaviors will improve  Medication Management: RN will administer medications as ordered by provider, will assess and evaluate patient's response and provide education to patient for prescribed medication. RN will report any adverse and/or side effects to prescribing provider.  Therapeutic Interventions: 1 on 1 counseling sessions, Psychoeducation, Medication administration, Evaluate responses to treatment, Monitor vital signs and CBGs as ordered, Perform/monitor CIWA, COWS, AIMS and Fall Risk screenings as ordered, Perform wound care treatments as ordered.  Evaluation of Outcomes: Not Met   LCSW Treatment Plan for Primary Diagnosis: <principal problem not specified> Long Term Goal(s): Safe transition to appropriate next level of care at discharge, Engage patient in therapeutic group addressing interpersonal concerns.  Short Term Goals: Engage patient in aftercare planning with referrals  and resources, Increase social support, Increase emotional regulation, Identify triggers associated with mental health/substance abuse issues and Increase skills for wellness and recovery  Therapeutic Interventions: Assess for all discharge needs, 1 to 1 time with Social worker, Explore available resources and support systems, Assess for adequacy in community support network, Educate family and significant other(s) on suicide prevention, Complete Psychosocial Assessment, Interpersonal group therapy.  Evaluation of Outcomes: Not Met   Progress in Treatment: Attending groups: Yes. Participating in groups: Yes. Taking medication as prescribed: Yes. Toleration medication: Yes. Family/Significant other contact made: No, will contact:  Supports if consents are granted. Patient understands diagnosis: Yes. Discussing patient identified problems/goals with staff: No. Medical problems stabilized or resolved: Yes. Denies suicidal/homicidal ideation: Yes. Issues/concerns per patient self-inventory:  No. Other:   New problem(s) identified: No, Describe:  CSW assessing.  New Short Term/Long Term Goal(s): Medication management for mood stabilization, elimination of SI thoughts, development of comprehensive mental wellness plan.  Patient Goals:  Did not participate in treatment team.  Discharge Plan or Barriers: Patient newly admitted to unit, CSW assessing for appropriate discharge plan.  Reason for Continuation of Hospitalization: Delusions  Depression Hallucinations  Estimated Length of Stay: 5-7 days  Attendees: Patient: 11/19/2019 10:29 AM  Physician: Dr. Jake Samples 11/19/2019 10:29 AM  Nursing: Vladimir Faster, RN 11/19/2019 10:29 AM  RN Care Manager: 11/19/2019 10:29 AM  Social Worker: Sherren Mocha, [employer] 11/19/2019 10:29 AM  Recreational Therapist:  11/19/2019 10:29 AM  Other: Stephanie Acre, [place] 11/19/2019 10:29 AM  Other:  11/19/2019 10:29 AM  Other: 11/19/2019 10:29 AM    Scribe for Treatment Team: Blane Ohara, LCSW 11/19/2019 10:29 AM
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
## 2019-11-20 DIAGNOSIS — F312 Bipolar disorder, current episode manic severe with psychotic features: Principal | ICD-10-CM

## 2019-11-20 LAB — PROLACTIN: Prolactin: 85.5 ng/mL — ABNORMAL HIGH (ref 4.8–23.3)

## 2019-11-20 MED ORDER — IBUPROFEN 800 MG PO TABS
800.0000 mg | ORAL_TABLET | Freq: Four times a day (QID) | ORAL | Status: DC | PRN
  Administered 2019-11-20 – 2019-11-21 (×2): 800 mg via ORAL
  Filled 2019-11-20 (×2): qty 1

## 2019-11-20 NOTE — Progress Notes (Signed)
   11/20/19 2000  Psych Admission Type (Psych Patients Only)  Admission Status Involuntary  Psychosocial Assessment  Patient Complaints Worrying;Other (Comment)  Eye Contact Fair  Facial Expression Anxious  Affect Appropriate to circumstance  Speech Logical/coherent  Interaction Assertive  Motor Activity Slow  Appearance/Hygiene Disheveled  Mood Suspicious  Thought Process  Coherency Circumstantial  Content Paranoia  Delusions WDL  Perception WDL  Hallucination None reported or observed  Judgment Impaired  Confusion None  Danger to Self  Current suicidal ideation? Denies  Danger to Others  Danger to Others None reported or observed   Pt moved to another room due to her roommate talking to people not seen scaring pt
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
## 2019-11-20 NOTE — Progress Notes (Signed)
Pt appeared to be seeking medications, pt stated she was anxious this evening and pt was observed to have numerous things for Anxiety throughout the day. Pt encouraged to ask the doctor to possibly space out some of her anxiety medication , which may help her in the evening.

## 2019-11-20 NOTE — BH Assessment (Signed)
Pt came to Probation officer requesting her own room due to her roommate walking around the room talking to herself. Pt stated she had issue with her talking to people not seen.

## 2019-11-20 NOTE — Progress Notes (Signed)
Recreation Therapy Notes  Date: 12.17.20 Time: 0950 Location: 500 Hall Dayroom  Group Topic: Anger Management  Goal Area(s) Addresses:  Patient will identify triggers for anger.  Patient will identify a situation that makes them angry.  Patient will identify what other emotions comes with anger.  Intervention: Worksheets  Activity:  Introduction to Anger Management.  Patients were to identify 3 situations/topics/people that lead to them being angry.  Patients would also identify what they do when angry, problems that have resulted because of anger and identify 3 positive coping skills to use when angry.  Education: Anger Management, Discharge Planning   Education Outcome: Acknowledges education/In group clarification offered/Needs additional education.   Clinical Observations/Feedback:  Pt did not attend group session.     Alyssa Allen, LRT/CTRS         Rondel Episcopo A 11/20/2019 10:29 AM 

## 2019-11-20 NOTE — Progress Notes (Signed)
[facility] MD Progress Note  11/20/2019 10:46 AM Alyssa Allen  MRN:  [id number] Subjective:    Continues to minimize issue with family, overall stable he removed his more euthymic today.  No thoughts of harming self or others, no auditory or visual hallucinations No EPS or TD  Principal Problem: Exacerbation of bipolar disorder/mixed Diagnosis: Active Problems:   Psychosis (HCC)   Bipolar I disorder, current or most recent episode manic, with psychotic features (HCC)  Total Time spent with patient: 20 minutes  Past Psychiatric History: past episodes  Past Medical History:  Past Medical History:  Diagnosis Date  . Allergy   . Anxiety   . Bipolar 1 disorder (HCC)   . Depression     Past Surgical History:  Procedure Laterality Date  . TONSILLECTOMY     Family History:  Family History  Problem Relation Age of Onset  . Hyperlipidemia Father   . CAD Father   . Cancer Mother    Family Psychiatric  History: see eval Social History:  Social History   Substance and Sexual Activity  Alcohol Use Yes  . Alcohol/week: 0.0 standard drinks     Social History   Substance and Sexual Activity  Drug Use Yes  . Frequency: 1.0 times per week  . Types: Cocaine, Marijuana   Comment: used 09/24/2016    Social History   Socioeconomic History  . Marital status: Single    Spouse name: Not on file  . Number of children: Not on file  . Years of education: Not on file  . Highest education level: Not on file  Occupational History  . Not on file  Tobacco Use  . Smoking status: Current Every Day Smoker    Packs/day: 1.00    Years: 17.00    Pack years: 17.00    Types: Cigarettes  . Smokeless tobacco: Never Used  Substance and Sexual Activity  . Alcohol use: Yes    Alcohol/week: 0.0 standard drinks  . Drug use: Yes    Frequency: 1.0 times per week    Types: Cocaine, Marijuana    Comment: used 09/24/2016  . Sexual activity: Yes    Birth control/protection: Condom  Other  Topics Concern  . Not on file  Social History Narrative  . Not on file   Social Determinants of Health   Financial Resource Strain:   . Difficulty of Paying Living Expenses: Not on file  Food Insecurity:   . Worried About Programme researcher, broadcasting/film/video in the Last Year: Not on file  . Ran Out of Food in the Last Year: Not on file  Transportation Needs:   . Lack of Transportation (Medical): Not on file  . Lack of Transportation (Non-Medical): Not on file  Physical Activity:   . Days of Exercise per Week: Not on file  . Minutes of Exercise per Session: Not on file  Stress:   . Feeling of Stress : Not on file  Social Connections:   . Frequency of Communication with Friends and Family: Not on file  . Frequency of Social Gatherings with Friends and Family: Not on file  . Attends Religious Services: Not on file  . Active Member of Clubs or Organizations: Not on file  . Attends Banker Meetings: Not on file  . Marital Status: Not on file   Additional Social History:                         Sleep: Good  Appetite:  Fair  Current Medications: Current Facility-Administered Medications  Medication Dose Route Frequency Provider Last Rate Last Admin  . bacitracin ointment   Topical Daily Denzil Magnusonhomas, Lashunda, NP   1 application at 11/20/19 [phone]  . benztropine (COGENTIN) tablet 0.5 mg  0.5 mg Oral BID Malvin JohnsFarah, Catlin Doria, MD   0.5 mg at 11/20/19 0737  . busPIRone (BUSPAR) tablet 15 mg  15 mg Oral TID Denzil Magnusonhomas, Lashunda, NP   15 mg at 11/20/19 0736  . carbamazepine (TEGRETOL) chewable tablet 100 mg  100 mg Oral TID Malvin JohnsFarah, Benito Lemmerman, MD   100 mg at 11/20/19 0736  . clonazePAM (KLONOPIN) tablet 1 mg  1 mg Oral TID Malvin JohnsFarah, Jerusalem Brownstein, MD   1 mg at 11/20/19 0740  . fenofibrate tablet 54 mg  54 mg Oral Daily Denzil Magnusonhomas, Lashunda, NP   54 mg at 11/20/19 0737  . gabapentin (NEURONTIN) capsule 300 mg  300 mg Oral TID Malvin JohnsFarah, Reni Hausner, MD   300 mg at 11/20/19 0737  . haloperidol (HALDOL) tablet 15 mg  15 mg Oral  QHS Malvin JohnsFarah, Deshea Pooley, MD   15 mg at 11/19/19 2054  . haloperidol (HALDOL) tablet 5 mg  5 mg Oral Q1200 Malvin JohnsFarah, Rayyan Orsborn, MD   5 mg at 11/19/19 1148  . metFORMIN (GLUCOPHAGE) tablet 1,000 mg  1,000 mg Oral Q supper Denzil Magnusonhomas, Lashunda, NP   1,000 mg at 11/19/19 1804  . metFORMIN (GLUCOPHAGE) tablet 500 mg  500 mg Oral Q breakfast Denzil Magnusonhomas, Lashunda, NP   500 mg at 11/20/19 0740  . nicotine (NICODERM CQ - dosed in mg/24 hours) patch 21 mg  21 mg Transdermal Daily Anike, Adaku C, NP   21 mg at 11/20/19 0738  . norethindrone-ethinyl estradiol (CYCLAFEM) tablet 1 tablet  1 tablet Oral Daily Denzil Magnusonhomas, Lashunda, NP      . prazosin (MINIPRESS) capsule 2 mg  2 mg Oral BID Denzil Magnusonhomas, Lashunda, NP   2 mg at 11/20/19 0737  . temazepam (RESTORIL) capsule 30 mg  30 mg Oral QHS PRN Malvin JohnsFarah, Malaisha Silliman, MD        Lab Results:  Results for orders placed or performed during the hospital encounter of 11/18/19 (from the past 48 hour(s))  Hemoglobin A1c     Status: None   Collection Time: 11/19/19  6:21 AM  Result Value Ref Range   Hgb A1c MFr Bld 5.3 4.8 - 5.6 %    Comment: (NOTE) Pre diabetes:          5.7%-6.4% Diabetes:              >6.4% Glycemic control for   <7.0% adults with diabetes    Mean Plasma Glucose 105.41 mg/dL    Comment: Performed at [facility], [address] [place], [place] [address]  Lipid panel     Status: Abnormal   Collection Time: 11/19/19  6:21 AM  Result Value Ref Range   Cholesterol 179 0 - 200 mg/dL   Triglycerides 409118 <811<150 mg/dL   HDL 47 >91>40 mg/dL   Total CHOL/HDL Ratio 3.8 RATIO   VLDL 24 0 - 40 mg/dL   LDL Cholesterol 478108 (H) 0 - 99 mg/dL    Comment:        Total Cholesterol/HDL:CHD Risk Coronary Heart Disease Risk Table                     Men   Women  1/2 Average Risk   3.4   3.3  Average Risk  5.0   4.4  2 X Average Risk   9.6   7.1  3 X Average Risk  23.4   11.0        Use the calculated Patient Ratio above and the CHD Risk Table to determine the patient's CHD Risk.         ATP III CLASSIFICATION (LDL):  <100     mg/dL   Optimal  100-129  mg/dL   Near or Above                    Optimal  130-159  mg/dL   Borderline  160-189  mg/dL   High  >190     mg/dL   Very High Performed at [facility] [address], [place], [place] [address]   Prolactin     Status: Abnormal   Collection Time: 11/19/19  6:21 AM  Result Value Ref Range   Prolactin 85.5 (H) 4.8 - 23.3 ng/mL    Comment: (NOTE) Performed At: [facility] [place], [place] [id number] Rush Farmer MD RJ:[id number]   TSH     Status: None   Collection Time: 11/19/19  6:21 AM  Result Value Ref Range   TSH 1.808 0.350 - 4.500 uIU/mL    Comment: Performed by a 3rd Generation assay with a functional sensitivity of <=0.01 uIU/mL. Performed at [facility], [place] [address], [place], [place] [address]     Blood Alcohol level:  Lab Results  Component Value Date   ETH <10 09/32/3557    Metabolic Disorder Labs: Lab Results  Component Value Date   HGBA1C 5.3 11/19/2019   MPG 105.41 11/19/2019   Lab Results  Component Value Date   PROLACTIN 85.5 (H) 11/19/2019   Lab Results  Component Value Date   CHOL 179 11/19/2019   TRIG 118 11/19/2019   HDL 47 11/19/2019   CHOLHDL 3.8 11/19/2019   VLDL 24 11/19/2019   LDLCALC 108 (H) 11/19/2019   LDLCALC 94 04/16/2013    Physical Findings: AIMS:  , ,  ,  ,    CIWA:    COWS:     Musculoskeletal: Strength & Muscle Tone: within normal limits Gait & Station: normal Patient leans: N/A  Psychiatric Specialty Exam: Physical Exam  Review of Systems  Blood pressure 109/79, pulse (!) 115, temperature 98.9 F (37.2 C), temperature source Oral, resp. rate 20, height 5\' 7"  (1.702 m), weight 105.2 kg, last menstrual period 11/01/2019, SpO2 99 %.Body mass index is 36.34 kg/m.  General Appearance: Casual  Eye Contact:  Minimal  Speech:  Clear and Coherent  Volume:  Decreased  Mood:   Dysphoric  Affect:  Congruent and Constricted  Thought Process:  Goal Directed and Descriptions of Associations: Tangential  Orientation:  Full (Time, Place, and Person)  Thought Content:  Logical and Tangential  Suicidal Thoughts:  No  Homicidal Thoughts:  No  Memory:  Immediate;   Fair Recent;   Fair Remote;   Fair  Judgement:  Fair  Insight:  Fair  Psychomotor Activity:  Normal  Concentration:  Concentration: Good and Attention Span: Good  Recall:  Good  Fund of Knowledge:  Good  Language:  Good  Akathisia:  Negative  Handed:  Right  AIMS (if indicated):     Assets:  Communication Skills Desire for Improvement  ADL's:  Intact  Cognition:  WNL  Sleep:  Number of Hours: 7.75     Treatment Plan Summary: Daily contact  with patient to assess and evaluate symptoms and progress in treatment and Medication management  Continue combination of meds we have reduced the overall med burden continue to transition off alprazolam gradually continue treatment of bipolar disorder with mood stabilizer and antipsychotic therapy continue current precautions  Nason Conradt, MD 11/20/2019, 10:46 AM
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
## 2019-11-21 MED ORDER — BUSPIRONE HCL 15 MG PO TABS: 15.0000 mg | ORAL_TABLET | Freq: Three times a day (TID) | ORAL | 2 refills | Status: DC

## 2019-11-21 MED ORDER — GABAPENTIN 400 MG PO CAPS: 400.0000 mg | ORAL_CAPSULE | Freq: Three times a day (TID) | ORAL | 2 refills | Status: DC

## 2019-11-21 MED ORDER — PRAZOSIN HCL 2 MG PO CAPS: 2.0000 mg | ORAL_CAPSULE | Freq: Every day | ORAL | 1 refills | Status: AC

## 2019-11-21 MED ORDER — CARBAMAZEPINE 100 MG PO CHEW: 100.0000 mg | CHEWABLE_TABLET | Freq: Three times a day (TID) | ORAL | 2 refills | Status: AC

## 2019-11-21 MED ORDER — HALOPERIDOL 5 MG PO TABS: ORAL_TABLET | ORAL | 1 refills | Status: DC

## 2019-11-21 MED ORDER — CLONAZEPAM 1 MG PO TABS: 1.0000 mg | ORAL_TABLET | Freq: Three times a day (TID) | ORAL | 0 refills | Status: DC

## 2019-11-21 MED ORDER — BENZTROPINE MESYLATE 0.5 MG PO TABS: 0.5000 mg | ORAL_TABLET | Freq: Two times a day (BID) | ORAL | 2 refills | Status: AC

## 2019-11-21 NOTE — Progress Notes (Signed)
Recreation Therapy Notes  Date: 12.18.20 Time: 1015 Location: [address] Dayroom  Group Topic: Coping Skills  Goal Area(s) Addresses:  Patient will identify positive coping skills. Patient will identify benefit of using positive coping skills post d/c.  Behavioral Response: Engaged  Intervention: Ines Bloomer  Activity: Scientist, clinical (histocompatibility and immunogenetics).  During each turn, patients were give a positive coping skill.  Patients could not repeat coping skills that had already been given.  Once the jenga tower falls over, the game would start over.  Education: Radiographer, therapeutic, Dentist.   Education Outcome: Acknowledges understanding/In group clarification offered/Needs additional education.   Clinical Observations/Feedback: Pt was bright and excited about discharging.  Pt identified some of her coping skills as cooking, having long conversations with her father, watching movies, hanging with friends and dancing.  During the activity, pt talked about how she has let what others think and the voices get the best of her.  Pt also explained giving so much energy to the negatives things had taken her joy to do the things that make her happy.  Pt expressed wanting to get back into things she likes to do such as cook for her family or just "be silly and let myself go".  Pt was smiling and social throughout activity.    Victorino Sparrow, LRT/CTRS    Ria Comment, Kailia Starry A 11/21/2019 11:09 AM

## 2019-11-21 NOTE — Progress Notes (Signed)
Pt discharged to lobby. Pt was stable and appreciative at that time. All papers and prescriptions were given and valuables returned. Verbal understanding expressed. Denies SI/HI and A/VH. Pt given opportunity to express concerns and ask questions.  

## 2019-11-21 NOTE — BHH Counselor (Signed)
As requested, CSW left patient's father a voicemail to confirm that patient is discharging today.  Stephanie Acre, MSW, [place] Social Worker [facility] Adult Unit  [phone]

## 2019-11-21 NOTE — Progress Notes (Signed)
[place] NOVEL CORONAVIRUS (COVID-19) DAILY CHECK-OFF SYMPTOMS - answer yes or no to each - every day NO YES  Have you had a fever in the past 24 hours?  . Fever (Temp > 37.80C / 100F) X   Have you had any of these symptoms in the past 24 hours? . New Cough .  Sore Throat  .  Shortness of Breath .  Difficulty Breathing .  Unexplained Body Aches   X   Have you had any one of these symptoms in the past 24 hours not related to allergies?   . Runny Nose .  Nasal Congestion .  Sneezing   X   If you have had runny nose, nasal congestion, sneezing in the past 24 hours, has it worsened?  X   EXPOSURES - check yes or no X   Have you traveled outside the state in the past 14 days?  X   Have you been in contact with someone with a confirmed diagnosis of COVID-19 or PUI in the past 14 days without wearing appropriate PPE?  X   Have you been living in the same home as a person with confirmed diagnosis of COVID-19 or a PUI (household contact)?    X   Have you been diagnosed with COVID-19?    X              What to do next: Answered NO to all: Answered YES to anything:   Proceed with unit schedule Follow the BHS Inpatient Flowsheet.   

## 2019-11-21 NOTE — Plan of Care (Signed)
Pt was able to identify benefits of improving self esteem at completion of recreation therapy group sessions.   Victorino Sparrow, LRT/CTRS

## 2019-11-21 NOTE — Progress Notes (Signed)
  [facility] Adult Case Management Discharge Plan :  Will you be returning to the same living situation after discharge:  Yes,  home. At discharge, do you have transportation home?: Yes,  father will pick her up. Do you have the ability to pay for your medications: Yes,  Medicaid.  Release of information consent forms completed and in the chart; Letter on chart.  Patient to Follow up at: Follow-up Information    [facility] [place] [facility] up on 11/25/2019.   Why: You are scheduled for a telemed appointment on Tuesday, December 22nd at 10:20am. Contact information: [address] [address] [phone]           Next level of care provider has access to [place] and Suicide Prevention discussed: Yes,  with father.   Has patient been refered to the Quitline?: Patient refused referral  Patient has been referred for addiction treatment: Yes  Joellen Jersey, [place] 11/21/2019, 9:13 AM
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
## 2019-11-21 NOTE — Progress Notes (Signed)
Spiritual care group facilitated by Chaplain Jerene Pitch, MDiv, B[facility].  Group focused on topic of HOPE.  Patients engaged in facilitated dialog around topic, naming places of hope in their lives and creating a word cloud on the board.  Pt's engaged in visual explorer exercise, choosing art pieces that connect with hope for today.   Alyssa Allen was invited to group.  Did not attend.

## 2019-11-21 NOTE — BHH Suicide Risk Assessment (Signed)
[facility] Discharge Suicide Risk Assessment   Principal Problem: <principal problem not specified> Discharge Diagnoses: Active Problems:   Psychosis ([place])   Bipolar I disorder, current or most recent episode manic, with psychotic features ([place])   Total Time spent with patient: 45 minutes  Musculoskeletal: Strength & Muscle Tone: within normal limits Gait & Station: normal Patient leans: N/A  Psychiatric Specialty Exam: Physical Exam  Review of Systems  Blood pressure 108/64, pulse (!) 110, temperature 98.3 F (36.8 C), temperature source Oral, resp. rate 20, height 5\' 7"  (1.702 m), weight 105.2 kg, last menstrual period 11/01/2019, SpO2 99 %.Body mass index is 36.34 kg/m.  General Appearance: Casual  Eye Contact:  Good  Speech:  Clear and Coherent  Volume:  Decreased  Mood:  Euthymic  Affect:  Appropriate  Thought Process:  Coherent and Goal Directed  Orientation:  Full (Time, Place, and Person)  Thought Content:  Logical  Suicidal Thoughts:  No  Homicidal Thoughts:  No  Memory:  Immediate;   Fair Recent;   Fair Remote;   Fair  Judgement:  Fair  Insight:  Fair  Psychomotor Activity:  Normal  Concentration:  Concentration: Fair and Attention Span: Fair  Recall:  [employer] of Knowledge:  Fair  Language:  Fair  Akathisia:  Negative  Handed:  Right  AIMS (if indicated):     Assets:  Armed forces logistics/support/administrative officer Housing Leisure Time Physical Health Resilience Social Support  ADL's:  Intact  Cognition:  WNL  Sleep:  Number of Hours: 8.25    Mental Status Per Nursing Assessment::   On Admission:  NA  Demographic Factors:  Caucasian and Unemployed  Loss Factors: NA  Historical Factors: NA  Risk Reduction Factors:   Sense of responsibility to family and Religious beliefs about death  Continued Clinical Symptoms:  Previous Psychiatric Diagnoses and Treatments  Cognitive Features That Contribute To Risk:  None    Suicide Risk:  Minimal: No identifiable suicidal  ideation.  Patients presenting with no risk factors but with morbid ruminations; may be classified as minimal risk based on the severity of the depressive symptoms  Follow-up Information    [facility] [place] [facility] Follow up on 11/25/2019.   Why: You are scheduled for a telemed appointment on Tuesday, December 22nd at 10:20am. Contact information: [place] [address] [phone]           Plan Of Care/Follow-up recommendations:  Activity:  full  Deandrae Wajda, MD 11/21/2019, 8:04 AM

## 2019-11-21 NOTE — Progress Notes (Signed)
Recreation Therapy Notes  INPATIENT RECREATION TR PLAN  Patient Details Name: Adaeze Better MRN: [id number] DOB: [birth year] Today's Date: 11/21/2019  Rec Therapy Plan Is patient appropriate for Therapeutic Recreation?: Yes Treatment times per week: about 3 days Estimated Length of Stay: 5-7 days TR Treatment/Interventions: Group participation (Comment)  Discharge Criteria Pt will be discharged from therapy if:: Discharged Treatment plan/goals/alternatives discussed and agreed upon by:: Patient/family  Discharge Summary Short term goals set: See patient care plan Short term goals met: Complete Progress toward goals comments: Groups attended Which groups?: Other (Comment), Coping skills(Team building) Reason goals not met: None Therapeutic equipment acquired: N/A Reason patient discharged from therapy: Discharge from hospital Pt/family agrees with progress & goals achieved: Yes Date patient discharged from therapy: 11/21/19   Victorino Sparrow, LRT/CTRS  Ria Comment, [place] 11/21/2019, 11:20 AM

## 2019-11-21 NOTE — Progress Notes (Signed)
D. Pt presents with a flat affect- calm and cooperative behavior- appropriate during interactions. Per pt's self inventory, pt rated her depression, hopelessness and anxiety a 1/1/2, respectively. Pt writes that her goal today is "going home". Pt currently denies SI/HI and AVH.  A. Labs and vitals monitored. Pt compliant with medications. Pt supported emotionally and encouraged to express concerns and ask questions.   R. Pt remains safe with 15 minute checks. Will continue POC.

## 2020-10-18 ENCOUNTER — Ambulatory Visit: Payer: Medicaid Other | Admitting: Physician Assistant

## 2020-10-18 ENCOUNTER — Other Ambulatory Visit: Payer: Self-pay

## 2020-10-18 DIAGNOSIS — Z113 Encounter for screening for infections with a predominantly sexual mode of transmission: Secondary | ICD-10-CM | POA: Diagnosis not present

## 2020-10-18 LAB — WET PREP FOR TRICH, YEAST, CLUE
Trichomonas Exam: NEGATIVE
Yeast Exam: NEGATIVE

## 2020-10-18 NOTE — Progress Notes (Signed)
Wet mount reviewed with provider and is negative today, so no treatment needed for wet mount per standing order and per provider verbal order. Counseled pt per provider orders and pt states understanding. Provider orders completed.

## 2020-10-19 ENCOUNTER — Encounter: Payer: Self-pay | Admitting: Physician Assistant

## 2020-10-19 NOTE — Progress Notes (Signed)
[facility] Department STI clinic/screening visit  Subjective:  Alyssa Allen is a 29 y.o. female being seen today for an STI screening visit. The patient reports they do have symptoms.  Patient reports that they do not desire a pregnancy in the next year.   They reported they are not interested in discussing contraception today.  No LMP recorded.   Patient has the following medical conditions:   Patient Active Problem List   Diagnosis Date Noted  . Bipolar I disorder, current or most recent episode manic, with psychotic features (HCC)   . Psychosis (HCC) 11/18/2019  . Bipolar affective disorder, mixed, severe, with psychotic behavior (HCC) 11/15/2019  . Closed fracture of transverse process of lumbar vertebra (HCC) 09/25/2016  . Adderall use disorder, severe (HCC) 09/21/2016  . Cannabis use disorder, severe, dependence (HCC) 09/12/2016  . Cocaine use disorder, severe, dependence (HCC) 09/12/2016  . History of ADHD 09/12/2016  . History of bipolar disorder 09/12/2016  . Opioid use disorder, moderate, dependence (HCC) 09/12/2016  . 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 H/O abnormal cervical Papanicolaou smear 10/07/2015  . Incontinence 10/07/2015  . Anxiety 06/12/2014  . Chronic pain due to trauma 06/12/2014    Chief Complaint  Patient presents with  . SEXUALLY TRANSMITTED DISEASE    screening    HPI  Patient reports that she has had dysuria and a swollen feeling for a few days.  Reports that she has had some slight itching and pressure feeling as well.  Denies other symptoms.  Patient reports that she has an appointment to be evaluated for an UTI on Wednesday of this week.  States last HIV test was 2 years ago and last pap was done around then as well. Reports that she is using [facility]Cs as her BCM.   See flowsheet for further details and programmatic requirements.    The following portions of the patient's history were reviewed  and updated as appropriate: allergies, current medications, past medical history, past social history, past surgical history and problem list.  Objective:  There were no vitals filed for this visit.  Physical Exam Constitutional:      General: She is not in acute distress.    Appearance: Normal appearance.  HENT:     Head: Normocephalic and atraumatic.     Comments: No nits,lice, or hair loss. No cervical, supraclavicular or axillary adenopathy.    Mouth/Throat:     Mouth: Mucous membranes are moist.     Pharynx: Oropharynx is clear. No oropharyngeal exudate or posterior oropharyngeal erythema.  Eyes:     Conjunctiva/sclera: Conjunctivae normal.  Pulmonary:     Effort: Pulmonary effort is normal.  Abdominal:     Palpations: Abdomen is soft. There is no mass.     Tenderness: There is no abdominal tenderness. There is no guarding or rebound.  Genitourinary:    General: Normal vulva.     Rectum: Normal.     Comments: External genitalia/pubic area without nits, lice, edema, erythema, lesions and inguinal adenopathy. Vagina with normal mucosa and discharge. Cervix without visible lesions. Uterus firm, mobile, nt, no masses, no CMT, no adnexal tenderness or fullness. Musculoskeletal:     Cervical back: Neck supple. No tenderness.  Skin:    General: Skin is warm and dry.     Findings: No bruising, erythema, lesion or rash.  Neurological:     Mental Status: She is alert and oriented to person, place, and time.  Psychiatric:  Mood and Affect: Mood normal.        Behavior: Behavior normal.        Thought Content: Thought content normal.        Judgment: Judgment normal.      Assessment and Plan:  Alyssa Allen is a 29 y.o. female presenting to the [facility] Department for STI screening  1. Screening for STD (sexually transmitted disease) Patient into clinic with symptoms. Enc patient to keep her scheduled appointment with PCP on Wednesday. Rec condoms  with all sex. Await test results.  Counseled that RN will call if needs to RTC for treatment once results are back. - WET PREP FOR TRICH, YEAST, CLUE - Gonococcus culture - Chlamydia/Gonorrhea [place] - HIV [facility] - Syphilis Serology, [place] [facility]     Return for PRN.  No future appointments.  Matt Holmes, PA

## 2020-10-23 LAB — GONOCOCCUS CULTURE

## 2020-11-01 ENCOUNTER — Telehealth: Payer: Self-pay | Admitting: Family Medicine

## 2020-11-01 NOTE — Telephone Encounter (Signed)
PAP RESULTS?

## 2020-11-01 NOTE — Telephone Encounter (Signed)
Call returned to patient. RN discussed TR from 11/15. GC/Chlamydia was not available yet. RN sent patient mychart code to have to check results on her own. All questions answered.   Harvie Heck, RN

## 2021-04-20 ENCOUNTER — Ambulatory Visit
Admission: EM | Admit: 2021-04-20 | Discharge: 2021-04-20 | Disposition: A | Discharge status: Home / Self Care | Payer: Medicaid Other | Attending: Family Medicine | Admitting: Family Medicine

## 2021-04-20 ENCOUNTER — Other Ambulatory Visit: Payer: Self-pay

## 2021-04-20 DIAGNOSIS — S80212A Abrasion, left knee, initial encounter: Secondary | ICD-10-CM

## 2021-04-20 HISTORY — DX: Dorsalgia, unspecified: M54.9

## 2021-04-20 NOTE — ED Provider Notes (Signed)
[facility]    CSN: [id number] Arrival date & time: 04/20/21  1754      History   Chief Complaint Chief Complaint  Patient presents with  . Knee Pain   HPI   30 year old female presents with the above complaint.  Patient reports that she suffered a fall while going down the steps 2 days ago.  She suffered an abrasion to the left knee.  She reports that the area is painful.  She is concerned that it may be infected.  Patient also notes that she has had headache which she believes is a migraine.  She developed this at work and was asked to leave work today.  She has taken ibuprofen with some improvement.  Pain is currently 6/10 in severity.  Localized to the left knee.  No other complaints.  Past Medical History:  Diagnosis Date  . Allergy   . Anxiety   . Back pain   . Bipolar 1 disorder (HCC)   . Depression     Patient Active Problem List   Diagnosis Date Noted  . Bipolar I disorder, current or most recent episode manic, with psychotic features (HCC)   . Psychosis (HCC) 11/18/2019  . Bipolar affective disorder, mixed, severe, with psychotic behavior (HCC) 11/15/2019  . Closed fracture of transverse process of lumbar vertebra (HCC) 09/25/2016  . Adderall use disorder, severe (HCC) 09/21/2016  . Cannabis use disorder, severe, dependence (HCC) 09/12/2016  . Cocaine use disorder, severe, dependence (HCC) 09/12/2016  . History of ADHD 09/12/2016  . History of bipolar disorder 09/12/2016  . Opioid use disorder, moderate, dependence (HCC) 09/12/2016  . 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 H/O abnormal cervical Papanicolaou smear 10/07/2015  . Incontinence 10/07/2015  . Anxiety 06/12/2014  . Chronic pain due to trauma 06/12/2014    Past Surgical History:  Procedure Laterality Date  . TONSILLECTOMY      OB History   No obstetric history on file.      Home Medications    Prior to Admission medications   Medication Sig  Start Date End Date Taking? Authorizing Provider  benztropine (COGENTIN) 0.5 MG tablet Take 1 tablet (0.5 mg total) by mouth 2 (two) times daily. 11/21/19  Yes Malvin Johns, MD  busPIRone (BUSPAR) 30 MG tablet Take by mouth. 09/15/20  Yes [provider]  CAPLYTA 42 MG CAPS Take 1 capsule by mouth at bedtime. 10/05/20  Yes [provider]  carbamazepine (TEGRETOL) 100 MG chewable tablet Chew 1 tablet (100 mg total) by mouth 3 (three) times daily. 11/21/19  Yes Malvin Johns, MD  gabapentin (NEURONTIN) 300 MG capsule Take by mouth. 10/01/20  Yes [provider]  haloperidol (HALDOL) 10 MG tablet Take by mouth. 10/04/20  Yes [provider]  ibuprofen (ADVIL) 800 MG tablet TAKE 1 TABLET BY MOUTH EVERY 8 HOURS AS NEEDED Patient taking differently: Take 800 mg by mouth 3 (three) times daily. 08/18/19  Yes Reubin Milan, MD  prazosin (MINIPRESS) 2 MG capsule Take 1 capsule (2 mg total) by mouth at bedtime. 11/21/19  Yes Malvin Johns, MD  sertraline (ZOLOFT) 100 MG tablet Take 100 mg by mouth daily. 09/23/20  Yes [provider]  metFORMIN (GLUCOPHAGE) 500 MG tablet Take 500-1,000 mg by mouth See admin instructions. Take 1 tablet (500mg ) by mouth every morning and take 2 tablets (1000mg ) by mouth every evening 09/16/19 04/20/21 Yes [provider]  fenofibrate (TRICOR) 48 MG tablet Take 48 mg by mouth daily.  Patient not taking: No sig reported 08/19/19   [provider]  norethindrone-ethinyl estradiol (CYCLAFEM,ALYACEN) 0.5/0.75/1-35 MG-MCG tablet Take by mouth.    [provider]  clonazePAM (KLONOPIN) 1 MG tablet Take 1 tablet (1 mg total) by mouth 3 (three) times daily. Patient not taking: No sig reported 11/21/19 04/20/21  Malvin Johns, MD    Family History Family History  Problem Relation Age of Onset  . Hyperlipidemia Father   . CAD Father   . Cancer Mother     Social History Social History   Tobacco Use  . Smoking  status: Current Every Day Smoker    Packs/day: 1.00    Years: 17.00    Pack years: 17.00    Types: Cigarettes  . Smokeless tobacco: Never Used  Vaping Use  . Vaping Use: Never used  Substance Use Topics  . Alcohol use: Not Currently    Alcohol/week: 0.0 standard drinks  . Drug use: Yes    Frequency: 1.0 times per week    Types: Cocaine, Marijuana    Comment: used 09/24/2016     Allergies   Patient has no known allergies.   Review of Systems Review of Systems  Musculoskeletal:       Left knee pain.  Skin: Positive for wound.  Neurological: Positive for headaches.   Physical Exam Triage Vital Signs ED Triage Vitals  Enc Vitals Group     BP 04/20/21 1936 126/88     Pulse Rate 04/20/21 1936 71     Resp 04/20/21 1936 18     Temp 04/20/21 1936 98.5 F (36.9 C)     Temp Source 04/20/21 1936 Oral     SpO2 04/20/21 1936 100 %     Weight --      Height --      Head Circumference --      Peak Flow --      Pain Score 04/20/21 1931 6     Pain Loc --      Pain Edu? --      Excl. in GC? --    Updated Vital Signs BP 126/88 (BP Location: Left Arm)   Pulse 71   Temp 98.5 F (36.9 C) (Oral)   Resp 18   LMP 03/21/2021   SpO2 100%   Visual Acuity Right Eye Distance:   Left Eye Distance:   Bilateral Distance:    Right Eye Near:   Left Eye Near:    Bilateral Near:     Physical Exam Constitutional:      General: She is not in acute distress.    Appearance: Normal appearance. She is obese. She is not ill-appearing.  HENT:     Head: Normocephalic and atraumatic.  Eyes:     General:        Right eye: No discharge.        Left eye: No discharge.     Conjunctiva/sclera: Conjunctivae normal.  Pulmonary:     Effort: Pulmonary effort is normal. No respiratory distress.  Skin:    Comments: Left knee with an abrasion.  No purulent discharge or fluctuance.  No significant surrounding erythema.  Neurological:     Mental Status: She is alert.  Psychiatric:      Comments: Flat affect.    UC Treatments / Results  Labs (all labs ordered are listed, but only abnormal results are displayed) Labs Reviewed - No data to display  EKG   Radiology No results found.  Procedures Procedures (including critical care  time)  Medications Ordered in UC Medications - No data to display  Initial Impression / Assessment and Plan / UC Course  I have reviewed the triage vital signs and the nursing notes.  Pertinent labs & imaging results that were available during my care of the patient were reviewed by me and considered in my medical decision making (see chart for details).    30 year old female presents with an abrasion to the left knee.  No evidence of infection.  No indications for imaging for the left knee.  Patient was given antibiotic ointment packets.  Work note given.  Supportive care.  Final Clinical Impressions(s) / UC Diagnoses   Final diagnoses:  Abrasion of left knee, initial encounter   Discharge Instructions   None    ED Prescriptions    None     PDMP not reviewed this encounter.   Tommie Sams, [place] 04/20/21 2010

## 2021-04-20 NOTE — ED Triage Notes (Signed)
Pt presents with L knee pain s/p fall two days ago.  Abrasion noted to L knee.  Pt feels it is infected.  Has not cleaned it or applied any ointment.  No redness or drainage noted.  Does not want xray.   Pt requests work note stating she stood up at work today and became dizzy and developed migraine.  Was told to leave work.  States she has h/o HA and does not feel that today's HA is unusual.  Took Ibuprofen today which has helped a little.

## 2021-06-23 ENCOUNTER — Other Ambulatory Visit: Payer: Self-pay

## 2021-06-23 ENCOUNTER — Ambulatory Visit (INDEPENDENT_AMBULATORY_CARE_PROVIDER_SITE_OTHER): Payer: Medicaid Other

## 2021-06-23 ENCOUNTER — Ambulatory Visit
Admission: EM | Admit: 2021-06-23 | Discharge: 2021-06-23 | Disposition: A | Discharge status: Home / Self Care | Payer: Medicaid Other | Attending: Emergency Medicine | Admitting: Emergency Medicine

## 2021-06-23 DIAGNOSIS — S2020XA Contusion of thorax, unspecified, initial encounter: Secondary | ICD-10-CM | POA: Diagnosis not present

## 2021-06-23 DIAGNOSIS — S20212A Contusion of left front wall of thorax, initial encounter: Secondary | ICD-10-CM | POA: Diagnosis not present

## 2021-06-23 DIAGNOSIS — W182XXA Fall in (into) shower or empty bathtub, initial encounter: Secondary | ICD-10-CM | POA: Diagnosis not present

## 2021-06-23 HISTORY — DX: Herpesviral infection, unspecified: B00.9

## 2021-06-23 MED ORDER — IBUPROFEN 600 MG PO TABS: 600.0000 mg | ORAL_TABLET | Freq: Four times a day (QID) | ORAL | 0 refills | Status: AC | PRN

## 2021-06-23 NOTE — Discharge Instructions (Addendum)
Your tracing not show any evidence of broken ribs.  Given the mechanism of injury to his muscle likely that you have bruised your chest wall.  Take the ibuprofen 600 mg every 6 hours with food to help with pain and inflammation.  Use topical over-the-counter lidocaine patches.  You can apply 1 patch every 8 hours to help with pain relief as well.  Return for reevaluation for any new or worsening symptoms.

## 2021-06-23 NOTE — ED Triage Notes (Signed)
Pt states she fell in shower, fell on left side on side of tub. Denies SX.

## 2021-06-23 NOTE — ED Provider Notes (Signed)
[facility]    CSN: [id number] Arrival date & time: 06/23/21  1718      History   Chief Complaint Chief Complaint  Patient presents with   Back Pain    HPI Alyssa Allen is a 30 y.o. female.   HPI  30 year old female here for evaluation of pain in her left back.  Patient reports that she became dizzy in the shower this morning and fell striking the edge of the tub with the left side of her back in her mid back.  She denies hitting her head or having a loss of consciousness.  She has not had any shortness of breath or cough.  When asked why she became dizzy and fell she states that she takes a lot of pain and psych medication that makes her drowsy.  She reports that she attempted to go to work today but she was having trouble performing her job because raising her arms up to shoulder height increase the pain in her back.  Past Medical History:  Diagnosis Date   Allergy    Anxiety    Back pain    Bipolar 1 disorder (HCC)    Depression    Herpes     Patient Active Problem List   Diagnosis Date Noted   Bipolar I disorder, current or most recent episode manic, with psychotic features (HCC)    Psychosis (HCC) 11/18/2019   Bipolar affective disorder, mixed, severe, with psychotic behavior (HCC) 11/15/2019   Closed fracture of transverse process of lumbar vertebra (HCC) 09/25/2016   Adderall use disorder, severe (HCC) 09/21/2016   Cannabis use disorder, severe, dependence (HCC) 09/12/2016   Cocaine use disorder, severe, dependence (HCC) 09/12/2016   History of ADHD 09/12/2016   History of bipolar disorder 09/12/2016   Opioid use disorder, moderate, dependence (HCC) 09/12/2016   Substance-induced psychotic disorder with delusions (HCC) 09/11/2016   H/O abnormal cervical Papanicolaou smear 10/07/2015   Incontinence 10/07/2015   Anxiety 06/12/2014   Chronic pain due to trauma 06/12/2014    Past Surgical History:  Procedure Laterality Date   TONSILLECTOMY       OB History   No obstetric history on file.      Home Medications    Prior to Admission medications   Medication Sig Start Date End Date Taking? Authorizing Provider  benztropine (COGENTIN) 0.5 MG tablet Take 1 tablet (0.5 mg total) by mouth 2 (two) times daily. 11/21/19  Yes Malvin Johns, MD  busPIRone (BUSPAR) 30 MG tablet Take by mouth. 09/15/20  Yes [provider]  CAPLYTA 42 MG CAPS Take 1 capsule by mouth at bedtime. 10/05/20  Yes [provider]  carbamazepine (TEGRETOL) 100 MG chewable tablet Chew 1 tablet (100 mg total) by mouth 3 (three) times daily. 11/21/19  Yes Malvin Johns, MD  fenofibrate (TRICOR) 48 MG tablet Take 48 mg by mouth daily. 08/19/19  Yes [provider]  gabapentin (NEURONTIN) 300 MG capsule Take by mouth. 10/01/20  Yes [provider]  haloperidol (HALDOL) 10 MG tablet Take by mouth. 10/04/20  Yes [provider]  ibuprofen (ADVIL) 600 MG tablet Take 1 tablet (600 mg total) by mouth every 6 (six) hours as needed. 06/23/21  Yes Becky Augusta, NP  norethindrone-ethinyl estradiol (CYCLAFEM,ALYACEN) 0.5/0.75/1-35 MG-MCG tablet Take by mouth.   Yes [provider]  prazosin (MINIPRESS) 2 MG capsule Take 1 capsule (2 mg total) by mouth at bedtime. 11/21/19  Yes Malvin Johns, MD  sertraline (ZOLOFT) 100 MG tablet  Take 100 mg by mouth daily. 09/23/20  Yes [provider]  clonazePAM (KLONOPIN) 1 MG tablet Take 1 tablet (1 mg total) by mouth 3 (three) times daily. Patient not taking: No sig reported 11/21/19 04/20/21  Malvin Johns, MD  metFORMIN (GLUCOPHAGE) 500 MG tablet Take 500-1,000 mg by mouth See admin instructions. Take 1 tablet (500mg ) by mouth every morning and take 2 tablets (1000mg ) by mouth every evening 09/16/19 04/20/21  [provider]    Family History Family History  Problem Relation Age of Onset   Hyperlipidemia Father    CAD Father    Cancer Mother     Social  History Social History   Tobacco Use   Smoking status: Every Day    Packs/day: 1.00    Years: 17.00    Pack years: 17.00    Types: Cigarettes   Smokeless tobacco: Never  Vaping Use   Vaping Use: Never used  Substance Use Topics   Alcohol use: Not Currently    Alcohol/week: 0.0 standard drinks   Drug use: Yes    Frequency: 1.0 times per week    Types: Cocaine, Marijuana    Comment: used 09/24/2016     Allergies   Patient has no known allergies.   Review of Systems Review of Systems  Constitutional:  Negative for fever.  Respiratory:  Negative for cough and shortness of breath.   Cardiovascular:  Positive for chest pain.  Skin:  Negative for color change and wound.  Hematological: Negative.   Psychiatric/Behavioral: Negative.      Physical Exam Triage Vital Signs ED Triage Vitals  Enc Vitals Group     BP 06/23/21 1731 (!) 132/100     Pulse Rate 06/23/21 1731 83     Resp 06/23/21 1731 16     Temp 06/23/21 1731 97.8 F (36.6 C)     Temp Source 06/23/21 1731 Oral     SpO2 06/23/21 1731 99 %     Weight 06/23/21 1729 260 lb (117.9 kg)     Height 06/23/21 1729 5\' 7"  (1.702 m)     Head Circumference --      Peak Flow --      Pain Score 06/23/21 1729 7     Pain Loc --      Pain Edu? --      Excl. in GC? --    No data found.  Updated Vital Signs BP (!) 132/100 (BP Location: Left Arm)   Pulse 83   Temp 97.8 F (36.6 C) (Oral)   Resp 16   Ht 5\' 7"  (1.702 m)   Wt 260 lb (117.9 kg)   LMP  (LMP Unknown)   SpO2 99%   BMI 40.72 kg/m   Visual Acuity Right Eye Distance:   Left Eye Distance:   Bilateral Distance:    Right Eye Near:   Left Eye Near:    Bilateral Near:     Physical Exam Vitals and nursing note reviewed.  Constitutional:      Appearance: She is obese. She is not ill-appearing or diaphoretic.  HENT:     Head: Normocephalic and atraumatic.  Cardiovascular:     Rate and Rhythm: Normal rate and regular rhythm.     Pulses: Normal pulses.      Heart sounds: Normal heart sounds. No murmur heard.   No gallop.  Pulmonary:     Effort: Pulmonary effort is normal.     Breath sounds: Normal breath sounds. No wheezing, rhonchi or rales.  Skin:    General: Skin is warm and dry.     Capillary Refill: Capillary refill takes less than 2 seconds.     Findings: No bruising or erythema.  Neurological:     General: No focal deficit present.     Mental Status: She is oriented to person, place, and time.  Psychiatric:        Mood and Affect: Mood normal.        Behavior: Behavior normal.        Thought Content: Thought content normal.        Judgment: Judgment normal.     UC Treatments / Results  Labs (all labs ordered are listed, but only abnormal results are displayed) Labs Reviewed - No data to display  EKG   Radiology DG Ribs Unilateral W/Chest Left  Result Date: 06/23/2021 CLINICAL DATA:  Fall onto posterior ribs this morning. Fall in the shower. Left-sided rib and posterior back pain. EXAM: LEFT RIBS AND CHEST - 3+ VIEW COMPARISON:  None. FINDINGS: No fracture or other bone lesions are seen involving the ribs. There is no evidence of pneumothorax or pleural effusion. Both lungs are clear. Heart size and mediastinal contours are within normal limits. IMPRESSION: Negative radiographs of the chest and left ribs. Electronically Signed   By: Narda RutherfordMelanie  Sanford M.D.   On: 06/23/2021 19:03    Procedures Procedures (including critical care time)  Medications Ordered in UC Medications - No data to display  Initial Impression / Assessment and Plan / UC Course  I have reviewed the triage vital signs and the nursing notes.  Pertinent labs & imaging results that were available during my care of the patient were reviewed by me and considered in my medical decision making (see chart for details).  Patient is a nontoxic-appearing 30 year old female here for evaluation of left-sided back pain after falling and hitting the side of the tub in  the shower this morning.  Patient states that she became dizzy in the shower and fell and she is attributing this to her psychiatric and pain medications that she takes.  She denies any shortness of breath or cough.  Physical exam reveals no bruising to her posterior chest wall.  Patient reports tenderness to palpation of the area but there is no crepitus noted on exam.  Lung sounds are clear to auscultation all fields.  Patient does seem very drowsy in the exam room and is yawning frequently.  She is able to speak in full and complete sentences.  Will obtain left rib films and chest x-ray to evaluate for bony injury.  Patient is requesting work note.  Chest x-ray and left rib films independently reviewed and evaluated by me.  Interpretation: No evidence of rib fracture identified.  Awaiting radiology overread. Radiology overread is negative for fracture or bony abnormality.  Will discharge patient home with a diagnosis of chest wall contusion.  Patient is currently taking Haldol and gabapentin.  She is also taking Cogentin and Tegretol.  We will have patient use ibuprofen 600 mg every 6 hours as needed with food as well as over-the-counter lidocaine patches.  There is a concern for the large degree of sedating medication that patient is currently taking and I am reluctant to prescribe any stronger pain medicine at this time for fear of patient having an additional fall.   Final Clinical Impressions(s) / UC Diagnoses   Final diagnoses:  Chest wall contusion, left, initial encounter     Discharge Instructions  Your tracing not show any evidence of broken ribs.  Given the mechanism of injury to his muscle likely that you have bruised your chest wall.  Take the ibuprofen 600 mg every 6 hours with food to help with pain and inflammation.  Use topical over-the-counter lidocaine patches.  You can apply 1 patch every 8 hours to help with pain relief as well.  Return for reevaluation for any  new or worsening symptoms.     ED Prescriptions     Medication Sig Dispense Auth. Provider   ibuprofen (ADVIL) 600 MG tablet Take 1 tablet (600 mg total) by mouth every 6 (six) hours as needed. 30 tablet Becky Augusta, NP      PDMP not reviewed this encounter.   Becky Augusta, NP 06/23/21 1918

## 2021-07-12 ENCOUNTER — Other Ambulatory Visit: Payer: Self-pay

## 2021-07-12 ENCOUNTER — Ambulatory Visit
Admission: EM | Admit: 2021-07-12 | Discharge: 2021-07-12 | Disposition: A | Discharge status: Home / Self Care | Payer: Medicaid Other

## 2021-07-12 DIAGNOSIS — G43019 Migraine without aura, intractable, without status migrainosus: Secondary | ICD-10-CM | POA: Diagnosis not present

## 2021-07-12 MED ORDER — ONDANSETRON 8 MG PO TBDP: 8.0000 mg | ORAL_TABLET | Freq: Three times a day (TID) | ORAL | 0 refills | Status: AC | PRN

## 2021-07-12 MED ORDER — RIZATRIPTAN BENZOATE 10 MG PO TABS: 10.0000 mg | ORAL_TABLET | ORAL | 0 refills | Status: AC | PRN

## 2021-07-12 NOTE — ED Provider Notes (Signed)
[facility]    CSN: [id number] Arrival date & time: 07/12/21  1624      History   Chief Complaint Chief Complaint  Patient presents with   Headache    HPI Alyssa Allen is a 30 y.o. female.   HPI  30 year old female here for evaluation of headache.  Patient reports that for the last week she has been waking up every day with what she terms a migraine headache.  The headache improves during the day but then it returns the next morning.  She takes ibuprofen which has helped in the past but it is not helping at present.  She states that this is making her light sensitive and she is nauseous in the mornings but she is not sure that is related to her other medications that she takes not.  She denies any changes to her vision, numbness, tingling, or weakness in her extremities.  She states that typically laying down in a dark room helps but has not been helping this time.  She states there is no predictable pattern to her migraines and sometimes they will be just above her eyebrows in the frontal region, sometimes the crown of her head, and tonight it is in the occiput.  Patient states that she is not on any abortive or preventative migraine medications.  She states that she saw neurology at [facility] 6 months ago for her migraines and they were initially going to order an MRI but then states after looking in her eyes they determined that she did not need an MRI.  Past Medical History:  Diagnosis Date   Allergy    Anxiety    Back pain    Bipolar 1 disorder (HCC)    Depression    Herpes     Patient Active Problem List   Diagnosis Date Noted   Bipolar I disorder, current or most recent episode manic, with psychotic features (HCC)    Psychosis (HCC) 11/18/2019   Bipolar affective disorder, mixed, severe, with psychotic behavior (HCC) 11/15/2019   Closed fracture of transverse process of lumbar vertebra (HCC) 09/25/2016   Adderall use disorder, severe (HCC) 09/21/2016    Cannabis use disorder, severe, dependence (HCC) 09/12/2016   Cocaine use disorder, severe, dependence (HCC) 09/12/2016   History of ADHD 09/12/2016   History of bipolar disorder 09/12/2016   Opioid use disorder, moderate, dependence (HCC) 09/12/2016   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      Home Medications    Prior to Admission medications   Medication Sig Start Date End Date Taking? Authorizing Provider  ALPRAZolam (XANAX XR) 1 MG 24 hr tablet Take 1 mg by mouth every morning. 06/23/21  Yes [provider]  ALPRAZolam (XANAX XR) 2 MG 24 hr tablet SMARTSIG:1 Tablet(s) By Mouth Every Evening 06/23/21  Yes [provider]  baclofen (LIORESAL) 10 MG tablet Take 10 mg by mouth 3 (three) times daily. 06/24/21  Yes [provider]  benztropine (COGENTIN) 0.5 MG tablet Take 1 tablet (0.5 mg total) by mouth 2 (two) times daily. 11/21/19  Yes Malvin Johns, MD  busPIRone (BUSPAR) 30 MG tablet Take by mouth. 09/15/20  Yes [provider]  Gillian Shields  42 MG CAPS Take 1 capsule by mouth at bedtime. 10/05/20  Yes [provider]  carbamazepine (TEGRETOL) 100 MG chewable tablet Chew 1 tablet (100 mg total) by mouth 3 (three) times daily. 11/21/19  Yes Malvin JohnsFarah, Brian, MD  celecoxib (CELEBREX) 100 MG capsule Take 100 mg by mouth 2 (two) times daily as needed. 04/29/21  Yes [provider]  clorazepate (TRANXENE) 7.5 MG tablet Take 7.5 mg by mouth 2 (two) times daily. 02/23/21  Yes [provider]  EQUETRO 300 MG CP12 Take 1 capsule by mouth 2 (two) times daily. 06/22/21  Yes [provider]  fenofibrate (TRICOR) 48 MG tablet Take 48 mg by mouth daily.  08/19/19  Yes [provider]  gabapentin (NEURONTIN) 300 MG capsule Take by mouth. 10/01/20  Yes [provider]  haloperidol (HALDOL) 10 MG tablet Take by mouth. 10/04/20  Yes [provider]  ibuprofen (ADVIL) 600 MG tablet Take 1 tablet (600 mg total) by mouth every 6 (six) hours as needed. 06/23/21  Yes Becky Augustayan, Emelynn Rance, NP  montelukast (SINGULAIR) 10 MG tablet Take 10 mg by mouth at bedtime. 06/22/21  Yes [provider]  norethindrone-ethinyl estradiol (CYCLAFEM,ALYACEN) 0.5/0.75/1-35 MG-MCG tablet Take by mouth.   Yes [provider]  ondansetron (ZOFRAN ODT) 8 MG disintegrating tablet Take 1 tablet (8 mg total) by mouth every 8 (eight) hours as needed for nausea or vomiting. 07/12/21  Yes Becky Augustayan, Unika Nazareno, NP  prazosin (MINIPRESS) 2 MG capsule Take 1 capsule (2 mg total) by mouth at bedtime. 11/21/19  Yes Malvin JohnsFarah, Brian, MD  rizatriptan (MAXALT) 10 MG tablet Take 1 tablet (10 mg total) by mouth as needed for migraine. May repeat in 2 hours if needed 07/12/21  Yes Becky Augustayan, Shakeila Pfarr, NP  sertraline (ZOLOFT) 100 MG tablet Take 100 mg by mouth daily. 09/23/20  Yes [provider]  valACYclovir (VALTREX) 1000 MG tablet Take 1,000 mg by mouth every morning. 06/22/21  Yes [provider]  clonazePAM (KLONOPIN) 1 MG tablet Take 1 tablet (1 mg total) by mouth 3 (three) times daily. Patient not taking: No sig reported 11/21/19 04/20/21  Malvin JohnsFarah, Brian, MD  metFORMIN (GLUCOPHAGE) 500 MG tablet Take 500-1,000 mg by mouth See admin instructions. Take 1 tablet (500mg ) by mouth every morning and take 2 tablets (1000mg ) by mouth every evening 09/16/19 04/20/21  [provider]    Family History Family History  Problem Relation Age of Onset   Hyperlipidemia Father    CAD Father    Cancer Mother     Social History Social History   Tobacco Use   Smoking status: Every Day    Packs/day: 1.00    Years: 17.00    Pack years: 17.00    Types: Cigarettes    Smokeless tobacco: Never  Vaping Use   Vaping Use: Never used  Substance Use Topics   Alcohol use: Not Currently    Alcohol/week: 0.0 standard drinks   Drug use: Yes    Frequency: 1.0 times per week    Types: Cocaine, Marijuana    Comment: used 09/24/2016     Allergies   Patient has no known allergies.   Review of Systems Review of Systems  Eyes:  Positive for photophobia. Negative for visual disturbance.  Gastrointestinal:  Positive for nausea.  Neurological:  Positive for headaches. Negative for weakness and numbness.  Hematological: Negative.   Psychiatric/Behavioral: Negative.      Physical Exam Triage Vital Signs ED Triage Vitals [07/12/21 1833]  Enc Vitals Group  BP      Pulse      Resp      Temp      Temp src      SpO2      Weight 240 lb (108.9 kg)     Height 5\' 7"  (1.702 m)     Head Circumference      Peak Flow      Pain Score 8     Pain Loc      Pain Edu?      Excl. in GC?    No data found.  Updated Vital Signs BP 128/90 (BP Location: Left Arm)   Pulse 91   Temp 99.2 F (37.3 C) (Oral)   Resp 18   Ht 5\' 7"  (1.702 m)   Wt 240 lb (108.9 kg)   LMP  (LMP Unknown)   SpO2 96%   BMI 37.59 kg/m   Visual Acuity Right Eye Distance:   Left Eye Distance:   Bilateral Distance:    Right Eye Near:   Left Eye Near:    Bilateral Near:     Physical Exam Vitals and nursing note reviewed.  Constitutional:      General: She is not in acute distress.    Appearance: Normal appearance. She is not ill-appearing.  HENT:     Head: Normocephalic and atraumatic.     Right Ear: Tympanic membrane, ear canal and external ear normal. There is no impacted cerumen.     Left Ear: Tympanic membrane, ear canal and external ear normal. There is no impacted cerumen.     Mouth/Throat:     Mouth: Mucous membranes are moist.     Pharynx: Oropharynx is clear. No oropharyngeal exudate or posterior oropharyngeal erythema.  Cardiovascular:     Rate and Rhythm: Normal  rate and regular rhythm.     Pulses: Normal pulses.     Heart sounds: Normal heart sounds. No murmur heard.   No gallop.  Pulmonary:     Effort: Pulmonary effort is normal.     Breath sounds: Normal breath sounds. No wheezing, rhonchi or rales.  Musculoskeletal:        General: Normal range of motion.     Cervical back: Normal range of motion and neck supple.  Lymphadenopathy:     Cervical: No cervical adenopathy.  Skin:    General: Skin is warm and dry.     Capillary Refill: Capillary refill takes less than 2 seconds.     Findings: No erythema or rash.  Neurological:     General: No focal deficit present.     Mental Status: She is alert and oriented to person, place, and time.     Cranial Nerves: No cranial nerve deficit.     Sensory: No sensory deficit.     Motor: No weakness.     Coordination: Coordination normal.     Gait: Gait normal.     Deep Tendon Reflexes: Reflexes normal.  Psychiatric:        Mood and Affect: Mood normal.        Behavior: Behavior normal.        Thought Content: Thought content normal.        Judgment: Judgment normal.     UC Treatments / Results  Labs (all labs ordered are listed, but only abnormal results are displayed) Labs Reviewed - No data to display  EKG   Radiology No results found.  Procedures Procedures (including critical care time)  Medications Ordered in UC  Medications - No data to display  Initial Impression / Assessment and Plan / UC Course  I have reviewed the triage vital signs and the nursing notes.  Pertinent labs & imaging results that were available during my care of the patient were reviewed by me and considered in my medical decision making (see chart for details).  Patient is a nontoxic-appearing 30 year old female here for evaluation of recurrent headache that has had for the past week.  She reports the headache begins every morning and then improve throughout the day.  She has been taking ibuprofen which has  not been helping with her headaches and typically does.  She states that she also typically gets relief from laying down in a dark room with that has not been helping either.  She is currently stating that her headache is a 6/10 and in her occiput.  The headache moves around and does not have a predictable pattern.  Patient reports that this is typical of the way her migraines present.  Patient's physical exam reveals cranial nerves II through XII grossly intact.  Pupils round reactive and EOMs intact.  Cardiopulmonary exam reveals clear lung sounds in all fields and S1-S2 heart sounds.  Patient's upper and lower extremity strength is 5/5 and her DTRs are 2+ globally.  Patient has no abnormalities on her neurological exam.  Will trial patient on Maxalt 10 mg and have her follow-up with neurology for continued or worsening symptoms.   Final Clinical Impressions(s) / UC Diagnoses   Final diagnoses:  Intractable migraine without aura and without status migrainosus     Discharge Instructions      Take 1 Maxalt at the onset of headache and you may repeat it in 2 hours as needed.  Do not take more than 3 tablets in 24-hour period.  Use the Zofran every 8 hours as needed for nausea.  Schedule follow-up appointment with neurology to discuss your recurrent and persistent migraines as well as treatment options.     ED Prescriptions     Medication Sig Dispense Auth. Provider   rizatriptan (MAXALT) 10 MG tablet Take 1 tablet (10 mg total) by mouth as needed for migraine. May repeat in 2 hours if needed 10 tablet Becky Augusta, NP   ondansetron (ZOFRAN ODT) 8 MG disintegrating tablet Take 1 tablet (8 mg total) by mouth every 8 (eight) hours as needed for nausea or vomiting. 20 tablet Becky Augusta, NP      PDMP not reviewed this encounter.   Becky Augusta, NP 07/12/21 1858

## 2021-07-12 NOTE — Discharge Instructions (Addendum)
Take 1 Maxalt at the onset of headache and you may repeat it in 2 hours as needed.  Do not take more than 3 tablets in 24-hour period.  Use the Zofran every 8 hours as needed for nausea.  Schedule follow-up appointment with neurology to discuss your recurrent and persistent migraines as well as treatment options.

## 2021-07-12 NOTE — ED Triage Notes (Signed)
Pt c/o migarines for the past week, having them daily. Pt states she wakes up with the migraine and then it improves during the day, but returns the next morning. Pt does not take maintenance migraine meds and does not have rescue meds. Pt has been taking ibuprofen. Pt was previously seen by neurology but has not followed up with them.

## 2021-07-15 ENCOUNTER — Ambulatory Visit
Admission: EM | Admit: 2021-07-15 | Discharge: 2021-07-15 | Disposition: A | Discharge status: Home / Self Care | Payer: Medicaid Other

## 2021-07-15 ENCOUNTER — Other Ambulatory Visit: Payer: Self-pay

## 2022-05-21 ENCOUNTER — Emergency Department: Payer: No Typology Code available for payment source

## 2022-05-21 ENCOUNTER — Emergency Department
Admission: EM | Admit: 2022-05-21 | Discharge: 2022-05-21 | Disposition: A | Discharge status: Home / Self Care | Payer: No Typology Code available for payment source | Attending: Emergency Medicine | Admitting: Emergency Medicine

## 2022-05-21 DIAGNOSIS — F10929 Alcohol use, unspecified with intoxication, unspecified: Secondary | ICD-10-CM

## 2022-05-21 DIAGNOSIS — F1410 Cocaine abuse, uncomplicated: Secondary | ICD-10-CM | POA: Insufficient documentation

## 2022-05-21 DIAGNOSIS — R4182 Altered mental status, unspecified: Secondary | ICD-10-CM | POA: Insufficient documentation

## 2022-05-21 DIAGNOSIS — F10920 Alcohol use, unspecified with intoxication, uncomplicated: Secondary | ICD-10-CM | POA: Insufficient documentation

## 2022-05-21 DIAGNOSIS — E876 Hypokalemia: Secondary | ICD-10-CM | POA: Diagnosis not present

## 2022-05-21 DIAGNOSIS — Y907 Blood alcohol level of 200-239 mg/100 ml: Secondary | ICD-10-CM | POA: Insufficient documentation

## 2022-05-21 DIAGNOSIS — R9431 Abnormal electrocardiogram [ECG] [EKG]: Secondary | ICD-10-CM | POA: Insufficient documentation

## 2022-05-21 DIAGNOSIS — F1090 Alcohol use, unspecified, uncomplicated: Secondary | ICD-10-CM | POA: Diagnosis present

## 2022-05-21 LAB — URINE DRUG SCREEN, QUALITATIVE (ARMC ONLY)
Amphetamines, Ur Screen: NOT DETECTED
Barbiturates, Ur Screen: NOT DETECTED
Benzodiazepine, Ur Scrn: NOT DETECTED
Cannabinoid 50 Ng, Ur ~~LOC~~: NOT DETECTED
Cocaine Metabolite,Ur ~~LOC~~: POSITIVE — AB
MDMA (Ecstasy)Ur Screen: NOT DETECTED
Methadone Scn, Ur: NOT DETECTED
Opiate, Ur Screen: NOT DETECTED
Phencyclidine (PCP) Ur S: NOT DETECTED
Tricyclic, Ur Screen: NOT DETECTED

## 2022-05-21 LAB — COMPREHENSIVE METABOLIC PANEL
ALT: 37 U/L (ref 0–44)
AST: 24 U/L (ref 15–41)
Albumin: 4.1 g/dL (ref 3.5–5.0)
Alkaline Phosphatase: 64 U/L (ref 38–126)
Anion gap: 12 (ref 5–15)
BUN: 18 mg/dL (ref 6–20)
CO2: 19 mmol/L — ABNORMAL LOW (ref 22–32)
Calcium: 8.7 mg/dL — ABNORMAL LOW (ref 8.9–10.3)
Chloride: 107 mmol/L (ref 98–111)
Creatinine, Ser: 0.56 mg/dL (ref 0.44–1.00)
GFR, Estimated: >60 mL/min (ref >60)
Glucose, Bld: 99 mg/dL (ref 70–99)
Potassium: 3 mmol/L — ABNORMAL LOW (ref 3.5–5.1)
Sodium: 138 mmol/L (ref 135–145)
Total Bilirubin: 0.5 mg/dL (ref 0.3–1.2)
Total Protein: 7.8 g/dL (ref 6.5–8.1)

## 2022-05-21 LAB — URINALYSIS, ROUTINE W REFLEX MICROSCOPIC
Bilirubin Urine: NEGATIVE
Glucose, UA: NEGATIVE mg/dL
Hgb urine dipstick: NEGATIVE
Ketones, ur: NEGATIVE mg/dL
Leukocytes,Ua: NEGATIVE
Nitrite: NEGATIVE
Protein, ur: NEGATIVE mg/dL
Specific Gravity, Urine: 1.002 — ABNORMAL LOW (ref 1.005–1.030)
pH: 6 (ref 5.0–8.0)

## 2022-05-21 LAB — CBC WITH DIFFERENTIAL/PLATELET
Abs Immature Granulocytes: 0.04 10*3/uL (ref 0.00–0.07)
Basophils Absolute: 0 10*3/uL (ref 0.0–0.1)
Basophils Relative: 0 %
Eosinophils Absolute: 0 10*3/uL (ref 0.0–0.5)
Eosinophils Relative: 0 %
HCT: 41.9 % (ref 36.0–46.0)
Hemoglobin: 13.8 g/dL (ref 12.0–15.0)
Immature Granulocytes: 0 %
Lymphocytes Relative: 25 %
Lymphs Abs: 2.5 10*3/uL (ref 0.7–4.0)
MCH: 30.7 pg (ref 26.0–34.0)
MCHC: 32.9 g/dL (ref 30.0–36.0)
MCV: 93.3 fL (ref 80.0–100.0)
Monocytes Absolute: 0.4 10*3/uL (ref 0.1–1.0)
Monocytes Relative: 4 %
Neutro Abs: 6.9 10*3/uL (ref 1.7–7.7)
Neutrophils Relative %: 71 %
Platelets: 312 10*3/uL (ref 150–400)
RBC: 4.49 MIL/uL (ref 3.87–5.11)
RDW: 11.8 % (ref 11.5–15.5)
WBC: 9.9 10*3/uL (ref 4.0–10.5)
nRBC: 0 % (ref 0.0–0.2)

## 2022-05-21 LAB — MAGNESIUM: Magnesium: 2.3 mg/dL (ref 1.7–2.4)

## 2022-05-21 LAB — ACETAMINOPHEN LEVEL: Acetaminophen (Tylenol), Serum: <10 ug/mL — ABNORMAL LOW (ref 10–30)

## 2022-05-21 LAB — ETHANOL: Alcohol, Ethyl (B): 229 mg/dL — ABNORMAL HIGH (ref <10)

## 2022-05-21 LAB — SALICYLATE LEVEL: Salicylate Lvl: <7 mg/dL — ABNORMAL LOW (ref 7.0–30.0)

## 2022-05-21 MED ORDER — POTASSIUM CHLORIDE 10 MEQ/100ML IV SOLN
10.0000 meq | INTRAVENOUS | Status: AC
  Administered 2022-05-21 (×2): 10 meq via INTRAVENOUS
  Filled 2022-05-21 (×2): qty 100

## 2022-05-21 MED ORDER — SODIUM CHLORIDE 0.9 % IV SOLN: INTRAVENOUS | Status: DC

## 2022-05-21 MED ORDER — POTASSIUM CHLORIDE CRYS ER 20 MEQ PO TBCR
40.0000 meq | EXTENDED_RELEASE_TABLET | Freq: Once | ORAL | Status: AC
  Administered 2022-05-21: 40 meq via ORAL
  Filled 2022-05-21: qty 2

## 2022-05-21 NOTE — ED Notes (Signed)
Per EMS, pt has "paraphernalia" in her purse.  Upon looking in her purse, 4 packs of unopened cigs, multiple lighters, 5.00 bill, empty baggie with a small straw, keys, wallet with no cash, and a knife that was locked up with security in the lobby.  Pt Alyssa Allen retrieve it upon discharge.

## 2022-05-21 NOTE — ED Provider Notes (Signed)
[facility] Provider Note    Event Date/Time   First MD Initiated Contact with Patient 05/21/22 0013     (approximate)   History   Alcohol Intoxication (Pt at bar and went unconscious after drinking heavily)   HPI  Alyssa Allen is a 31 y.o. female with history of bipolar disorder, depression, anxiety who presents to the emergency department EMS after she collapsed at a bar.  Reportedly was drinking alcohol tonight and using cocaine.  Patient is responsive to painful stimuli and will open her eyes and move all extremities but then quickly falls back asleep.  Does not answer questions.  Blood glucose with EMS was 93.   History provided by EMS.  Level 5 caveat due to intoxication.    Past Medical History:  Diagnosis Date   Allergy    Anxiety    Back pain    Bipolar 1 disorder (HCC)    Depression    Herpes     Past Surgical History:  Procedure Laterality Date   TONSILLECTOMY      MEDICATIONS:  Prior to Admission medications   Medication Sig Start Date End Date Taking? Authorizing Provider  ALPRAZolam (XANAX XR) 1 MG 24 hr tablet Take 1 mg by mouth every morning. 06/23/21   [provider]  ALPRAZolam (XANAX XR) 2 MG 24 hr tablet SMARTSIG:1 Tablet(s) By Mouth Every Evening 06/23/21   [provider]  baclofen (LIORESAL) 10 MG tablet Take 10 mg by mouth 3 (three) times daily. 06/24/21   [provider]  benztropine (COGENTIN) 0.5 MG tablet Take 1 tablet (0.5 mg total) by mouth 2 (two) times daily. 11/21/19   Malvin Johns, MD  busPIRone (BUSPAR) 30 MG tablet Take by mouth. 09/15/20   [provider]  CAPLYTA 42 MG CAPS Take 1 capsule by mouth at bedtime. 10/05/20   [provider]  carbamazepine (TEGRETOL) 100 MG chewable tablet Chew 1 tablet (100 mg total) by mouth 3 (three) times daily. 11/21/19   Malvin Johns, MD  celecoxib (CELEBREX) 100 MG capsule Take 100 mg by mouth 2 (two) times daily as needed.  04/29/21   [provider]  clorazepate (TRANXENE) 7.5 MG tablet Take 7.5 mg by mouth 2 (two) times daily. 02/23/21   [provider]  EQUETRO 300 MG CP12 Take 1 capsule by mouth 2 (two) times daily. 06/22/21   [provider]  fenofibrate (TRICOR) 48 MG tablet Take 48 mg by mouth daily. 08/19/19   [provider]  gabapentin (NEURONTIN) 300 MG capsule Take by mouth. 10/01/20   [provider]  haloperidol (HALDOL) 10 MG tablet Take by mouth. 10/04/20   [provider]  ibuprofen (ADVIL) 600 MG tablet Take 1 tablet (600 mg total) by mouth every 6 (six) hours as needed. 06/23/21   Becky Augusta, NP  montelukast (SINGULAIR) 10 MG tablet Take 10 mg by mouth at bedtime. 06/22/21   [provider]  norethindrone-ethinyl estradiol (CYCLAFEM,ALYACEN) 0.5/0.75/1-35 MG-MCG tablet Take by mouth.    [provider]  ondansetron (ZOFRAN ODT) 8 MG disintegrating tablet Take 1 tablet (8 mg total) by mouth every 8 (eight) hours as needed for nausea or vomiting. 07/12/21   Becky Augusta, NP  prazosin (MINIPRESS) 2 MG capsule Take 1 capsule (2 mg total) by mouth at bedtime. 11/21/19   Malvin Johns, MD  rizatriptan (MAXALT) 10 MG tablet Take 1 tablet (10 mg total) by mouth as needed for migraine. May repeat in 2 hours  if needed 07/12/21   Becky Augusta, NP  sertraline (ZOLOFT) 100 MG tablet Take 100 mg by mouth daily. 09/23/20   [provider]  valACYclovir (VALTREX) 1000 MG tablet Take 1,000 mg by mouth every morning. 06/22/21   [provider]  clonazePAM (KLONOPIN) 1 MG tablet Take 1 tablet (1 mg total) by mouth 3 (three) times daily. Patient not taking: No sig reported 11/21/19 04/20/21  Malvin Johns, MD  metFORMIN (GLUCOPHAGE) 500 MG tablet Take 500-1,000 mg by mouth See admin instructions. Take 1 tablet (500mg ) by mouth every morning and take 2 tablets (1000mg ) by mouth every evening 09/16/19 04/20/21  [provider]     Physical Exam   Triage Vital Signs: ED Triage Vitals  Enc Vitals Group     BP 05/21/22 0010 (!) 130/91     Pulse Rate 05/21/22 0010 97     Resp --      Temp 05/21/22 0012 97.7 F (36.5 C)     Temp Source 05/21/22 0012 Axillary     SpO2 05/21/22 0010 95 %     Weight --      Height --      Head Circumference --      Peak Flow --      Pain Score --      Pain Loc --      Pain Edu? --      Excl. in GC? --     Most recent vital signs: Vitals:   05/21/22 0230 05/21/22 0300  BP: 117/72 109/67  Pulse: 77 75  Resp: 14 13  Temp:    SpO2: 97% 97%    CONSTITUTIONAL: Patient is intoxicated.  She arouses to painful stimuli and will open her eyes and move all extremities but does not answer questions. HEAD: Normocephalic, atraumatic EYES: Conjunctivae clear, pupils dilated but reactive bilaterally.  Sclera nonicteric. ENT: normal nose; moist mucous membranes NECK: Supple, normal ROM, no step-off or deformity CARD: RRR; S1 and S2 appreciated; no murmurs, no clicks, no rubs, no gallops RESP: Normal chest excursion without splinting or tachypnea; breath sounds clear and equal bilaterally; no wheezes, no rhonchi, no rales, no hypoxia or respiratory distress, speaking full sentences ABD/GI: Normal bowel sounds; non-distended; soft, non-tender, no rebound, no guarding, no peritoneal signs BACK: The back appears normal EXT: Normal ROM in all joints; no deformity noted, no edema; no cyanosis SKIN: Normal color for age and race; warm; no rash on exposed skin NEURO: Moves all extremities equally, does not answer questions or follow commands but will open eyes to painful stimuli    ED Results / Procedures / Treatments   LABS: (all labs ordered are listed, but only abnormal results are displayed) Labs Reviewed  COMPREHENSIVE METABOLIC PANEL - Abnormal; Notable for the following components:      Result Value   Potassium 3.0 (*)    CO2 19 (*)    Calcium 8.7 (*)    All other  components within normal limits  ETHANOL - Abnormal; Notable for the following components:   Alcohol, Ethyl (B) 229 (*)    All other components within normal limits  ACETAMINOPHEN LEVEL - Abnormal; Notable for the following components:   Acetaminophen (Tylenol), Serum <10 (*)    All other components within normal limits  SALICYLATE LEVEL - Abnormal; Notable for the following components:   Salicylate Lvl <7.0 (*)    All other components within normal limits  URINALYSIS, ROUTINE W REFLEX MICROSCOPIC - Abnormal; Notable for the following  components:   Color, Urine COLORLESS (*)    APPearance CLEAR (*)    Specific Gravity, Urine 1.002 (*)    All other components within normal limits  URINE DRUG SCREEN, QUALITATIVE ([facility] ONLY) - Abnormal; Notable for the following components:   Cocaine Metabolite,Ur [place] POSITIVE (*)    All other components within normal limits  CBC WITH DIFFERENTIAL/PLATELET  MAGNESIUM     EKG:  EKG Interpretation  Date/Time:  Sunday May 21 2022 00:21:37 EDT Ventricular Rate:  92 PR Interval:  180 QRS Duration: 92 QT Interval:  418 QTC Calculation: 518 R Axis:   62 Text Interpretation: Sinus rhythm Borderline repolarization abnormality Prolonged QT interval Confirmed by Rochele Raring [id number]) on 05/21/2022 12:46:14 AM         EKG Interpretation  Date/Time:  Sunday May 21 2022 04:52:59 EDT Ventricular Rate:  80 PR Interval:  174 QRS Duration: 101 QT Interval:  415 QTC Calculation: 479 R Axis:   66 Text Interpretation: Sinus rhythm Borderline prolonged QT interval Confirmed by Rochele Raring [phone]) on 05/21/2022 5:03:26 AM        RADIOLOGY: My personal review and interpretation of imaging: CT head and cervical spine show no acute traumatic injury.  I have personally reviewed all radiology reports.   CT HEAD WO CONTRAST ( )  Result Date: 05/21/2022 CLINICAL DATA:  Mental status change, unknown cause; Neck trauma, intoxicated or obtunded (Age >=  16y) EXAM: CT HEAD WITHOUT CONTRAST CT CERVICAL SPINE WITHOUT CONTRAST TECHNIQUE: Multidetector CT imaging of the head and cervical spine was performed following the standard protocol without intravenous contrast. Multiplanar CT image reconstructions of the cervical spine were also generated. RADIATION DOSE REDUCTION: This exam was performed according to the departmental dose-optimization program which includes automated exposure control, adjustment of the mA and/or kV according to patient size and/or use of iterative reconstruction technique. COMPARISON:  None Available. FINDINGS: CT HEAD FINDINGS Brain: Normal anatomic configuration. No abnormal intra or extra-axial mass lesion or fluid collection. No abnormal mass effect or midline shift. No evidence of acute intracranial hemorrhage or infarct. Ventricular size is normal. Cerebellum unremarkable. Vascular: Unremarkable Skull: Intact Sinuses/Orbits: Moderate mucosal thickening within the left maxillary sinus without air-fluid level. Remaining paranasal sinuses are clear. Nasal septum appears eroded. Orbits are unremarkable. Other: Mastoid air cells and middle ear cavities are clear. CT CERVICAL SPINE FINDINGS Alignment: Normal. Skull base and vertebrae: No acute fracture. No primary bone lesion or focal pathologic process. Soft tissues and spinal canal: No prevertebral fluid or swelling. No visible canal hematoma. Disc levels: Intervertebral disc heights are preserved. Prevertebral soft tissues are not thickened. Spinal canal is widely patent. No significant neuroforaminal narrowing. Upper chest: Negative. Other: None IMPRESSION: 1. No acute intracranial abnormality. No calvarial fracture. 2. Moderate left maxillary sinus disease. 3. No acute fracture or listhesis of the cervical spine.  None Electronically Signed   By: Helyn Numbers M.D.   On: 05/21/2022 01:12   CT Cervical Spine Wo Contrast  Result Date: 05/21/2022 CLINICAL DATA:  Mental status change,  unknown cause; Neck trauma, intoxicated or obtunded (Age >= 16y) EXAM: CT HEAD WITHOUT CONTRAST CT CERVICAL SPINE WITHOUT CONTRAST TECHNIQUE: Multidetector CT imaging of the head and cervical spine was performed following the standard protocol without intravenous contrast. Multiplanar CT image reconstructions of the cervical spine were also generated. RADIATION DOSE REDUCTION: This exam was performed according to the departmental dose-optimization program which includes automated exposure control, adjustment of the mA and/or kV according to patient size and/or  use of iterative reconstruction technique. COMPARISON:  None Available. FINDINGS: CT HEAD FINDINGS Brain: Normal anatomic configuration. No abnormal intra or extra-axial mass lesion or fluid collection. No abnormal mass effect or midline shift. No evidence of acute intracranial hemorrhage or infarct. Ventricular size is normal. Cerebellum unremarkable. Vascular: Unremarkable Skull: Intact Sinuses/Orbits: Moderate mucosal thickening within the left maxillary sinus without air-fluid level. Remaining paranasal sinuses are clear. Nasal septum appears eroded. Orbits are unremarkable. Other: Mastoid air cells and middle ear cavities are clear. CT CERVICAL SPINE FINDINGS Alignment: Normal. Skull base and vertebrae: No acute fracture. No primary bone lesion or focal pathologic process. Soft tissues and spinal canal: No prevertebral fluid or swelling. No visible canal hematoma. Disc levels: Intervertebral disc heights are preserved. Prevertebral soft tissues are not thickened. Spinal canal is widely patent. No significant neuroforaminal narrowing. Upper chest: Negative. Other: None IMPRESSION: 1. No acute intracranial abnormality. No calvarial fracture. 2. Moderate left maxillary sinus disease. 3. No acute fracture or listhesis of the cervical spine.  None Electronically Signed   By: Helyn Numbers M.D.   On: 05/21/2022 01:12     PROCEDURES:  Critical Care  performed: Yes, see critical care procedure note(s)   CRITICAL CARE Performed by: Baxter Hire Chrishonda Hesch   Total critical care time: 45 minutes  Critical care time was exclusive of separately billable procedures and treating other patients.  Critical care was necessary to treat or prevent imminent or life-threatening deterioration.  Critical care was time spent personally by me on the following activities: development of treatment plan with patient and/or surrogate as well as nursing, discussions with consultants, evaluation of patient's response to treatment, examination of patient, obtaining history from patient or surrogate, ordering and performing treatments and interventions, ordering and review of laboratory studies, ordering and review of radiographic studies, pulse oximetry and re-evaluation of patient's condition.   Marland Kitchen1-3 Lead EKG Interpretation  Performed by: Afton Mikelson, Layla Maw, DO Authorized by: Warden Buffa, Layla Maw, DO     Interpretation: normal     ECG rate:  97   ECG rate assessment: normal     Rhythm: sinus rhythm     Ectopy: none     Conduction: normal       IMPRESSION / MDM / ASSESSMENT AND PLAN / ED COURSE  I reviewed the triage vital signs and the nursing notes.    Patient here with intoxication, altered mental status.  The patient is on the cardiac monitor to evaluate for evidence of arrhythmia and/or significant heart rate changes.   DIFFERENTIAL DIAGNOSIS (includes but not limited to):   Intoxication, concussion, intracranial hemorrhage, skull fracture, cervical spine fracture   Patient's presentation is most consistent with acute presentation with potential threat to life or bodily function.   PLAN: We will obtain CBC, CMP, ethanol level, urine drug screen, Tylenol and salicylate levels, CT head and cervical spine.  We will give IV fluids.  Patient altered but protecting her airway.  Hemodynamically stable.   MEDICATIONS GIVEN IN ED: Medications  0.9 %  sodium  chloride infusion ( Intravenous New Bag/Given 05/21/22 0026)  potassium chloride 10 mEq in 100 mL IVPB (0 mEq Intravenous Stopped 05/21/22 0250)  potassium chloride SA (KLOR-CON M) CR tablet 40 mEq (40 mEq Oral Given 05/21/22 0355)     ED COURSE: Patient's lab work shows potassium of 3.0 and mild acidosis with normal anion gap.  Alcohol level of 229.  Magnesium level normal.  Tylenol and salicylate levels negative.  Drug screen positive for cocaine.  CT head and cervical spine reviewed/interpreted by myself radiology and show no acute traumatic injury.  Patient now more awake, arousable, conversant.  Tolerating p.o.  Able to ambulate.  She has no complaints.  States "I just passed out at the bar because I drank too much".  She is working on getting a sober ride home.  Patient EKG showed prolonged QT interval but this has improved since getting IV and oral potassium replacement.   At this time, I do not feel there is any life-threatening condition present. I reviewed all nursing notes, vitals, pertinent previous records.  All lab and urine results, EKGs, imaging ordered have been independently reviewed and interpreted by myself.  I reviewed all available radiology reports from any imaging ordered this visit.  Based on my assessment, I feel the patient is safe to be discharged home without further emergent workup and can continue workup as an outpatient as needed. Discussed all findings, treatment plan as well as usual and customary return precautions with patient.  They verbalize understanding and are comfortable with this plan.  Outpatient follow-up has been provided as needed.  All questions have been answered.    CONSULTS: Admission considered but patient now awake, alert, tolerating p.o., ambulatory and QT interval has improved.   OUTSIDE RECORDS REVIEWED: Reviewed patient's last office visit with OB/GYN on 01/04/2017.       FINAL CLINICAL IMPRESSION(S) / ED DIAGNOSES   Final diagnoses:   Alcoholic intoxication with complication (HCC)  Cocaine abuse (HCC)  Hypokalemia  Prolonged Q-T interval on ECG     Rx / DC Orders   ED Discharge Orders     None        Note:  This document was prepared using Dragon voice recognition software and may include unintentional dictation errors.   Anjeli Casad, Layla MawKristen N, DO 05/21/22 [phone]

## 2022-05-21 NOTE — ED Notes (Signed)
Pt walked to toilet and back. Pt swaying and stumbling. Pt assisted back to bed after linens changed. Pt attached to monitors

## 2022-05-21 NOTE — Discharge Instructions (Addendum)
Steps to find a Primary Care Provider (PCP):  Call [phone] or [phone] to access "[facility] Find a Doctor Service."  2.  You may also go on the [facility] [facility] website at [email or web address]  

## 2022-09-28 IMAGING — CT CT HEAD W/O CM
4 series · 16 of 47 positions shown, 18 images · non-contrast
Comparison: None Available.

CLINICAL DATA: Mental status change, unknown cause; Neck trauma,
intoxicated or obtunded (Age >= 16y)



[Series 2: head wo · axial · 0.43mm/px · z∈[-100,+20]mm · 7 of 33 slices shown, 9 images]
[im 5/33  brain]
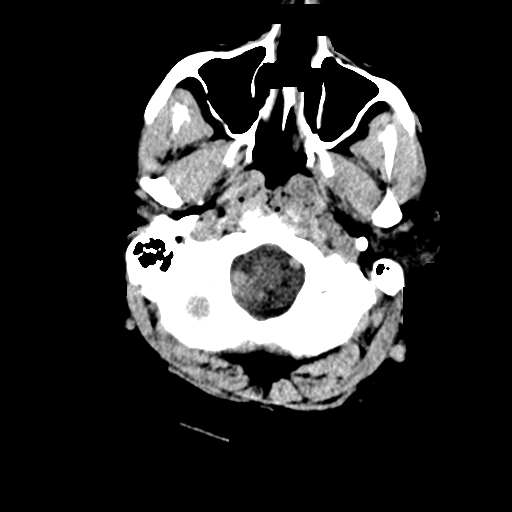
[im 5/33  bone]
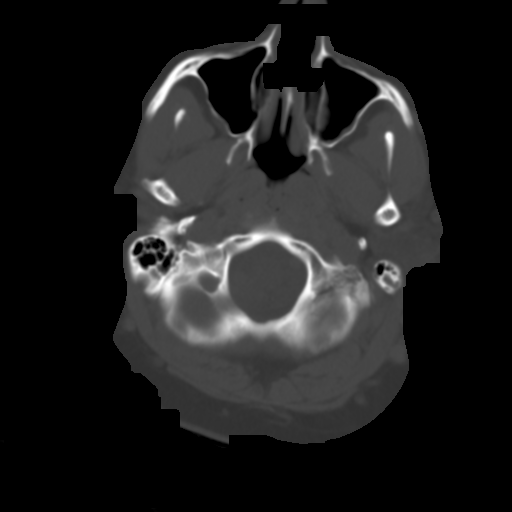
[im 9/33  brain]
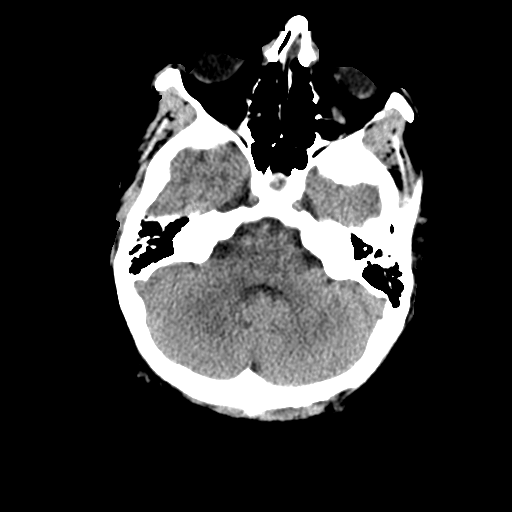
[im 13/33  brain]
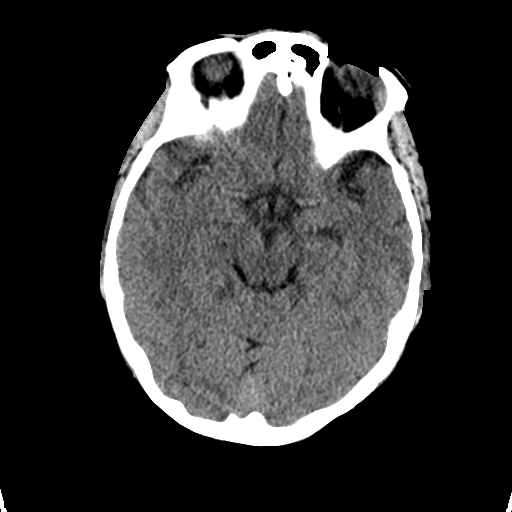
[im 17/33  brain]
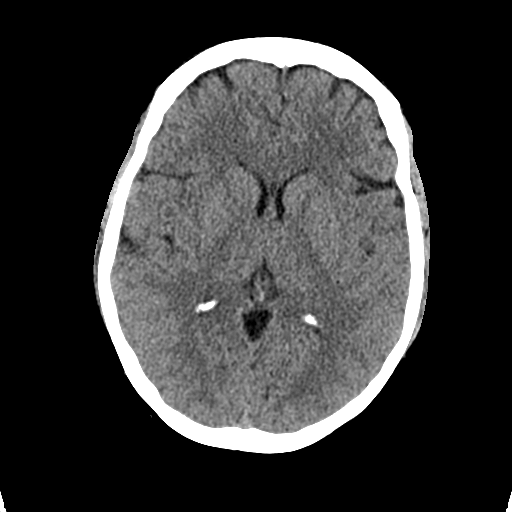
[im 21/33  brain]
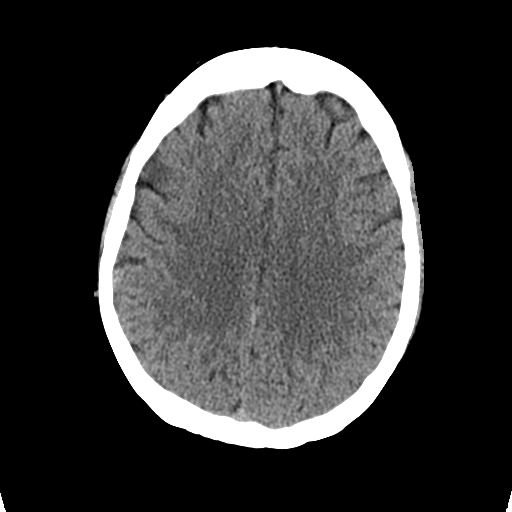
[im 21/33  bone]
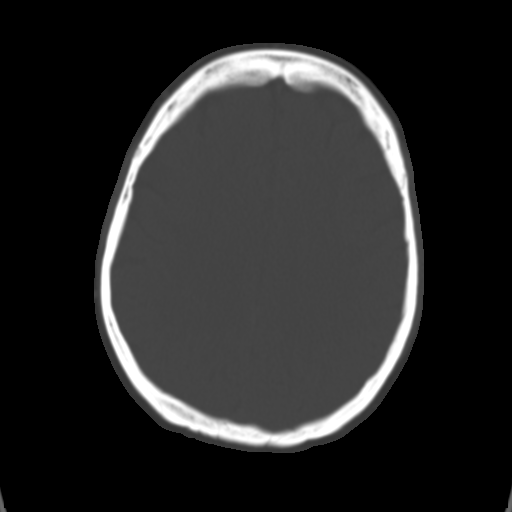
[im 25/33  brain]
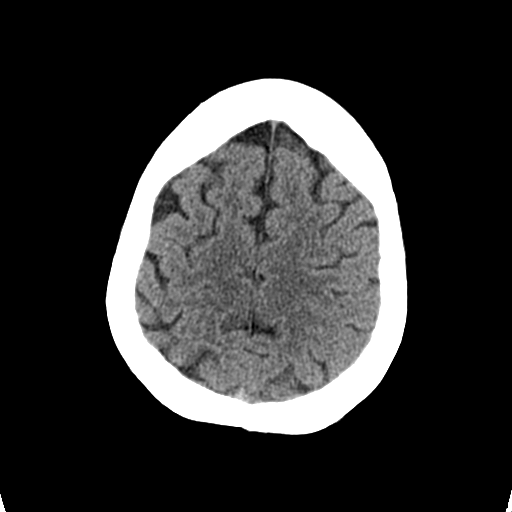
[im 29/33  brain]
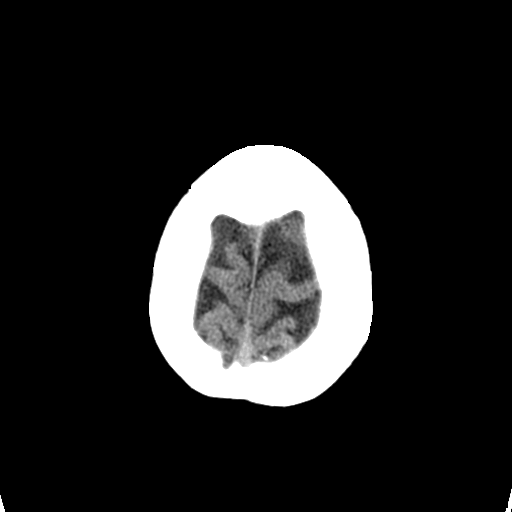

[Series 3: head bone · axial · 0.43mm/px · z∈[-104,-72]mm · 3 of 83 slices shown]
[im 9/83  bone]
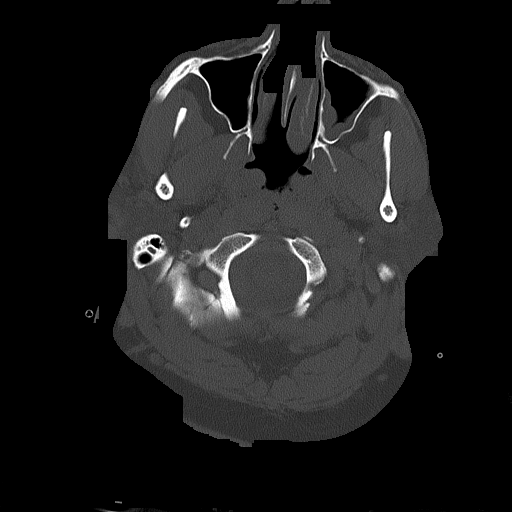
[im 17/83  bone]
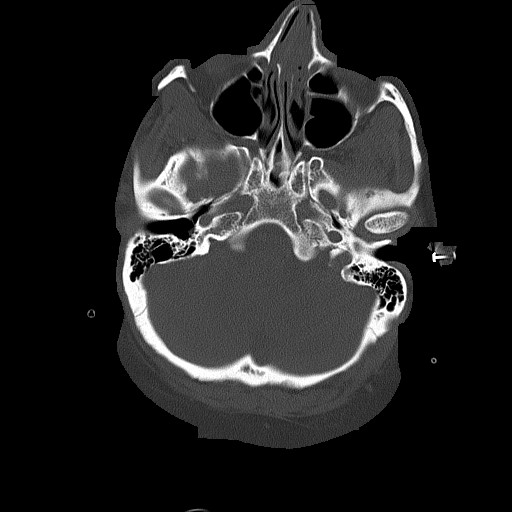
[im 25/83  bone]
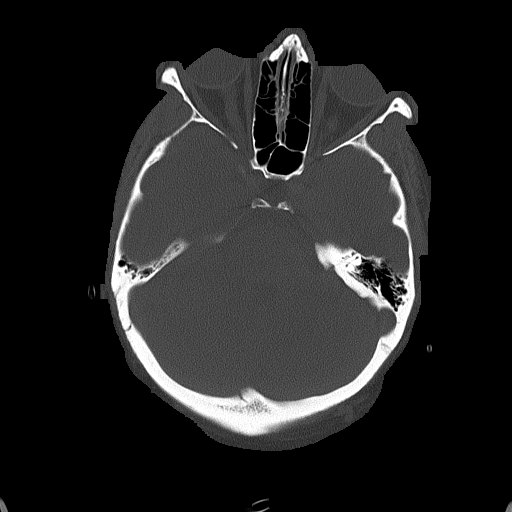

[Series 4: coronal soft tissue · coronal · 0.34mm/px · 3 of 73 slices shown]
[im 25/73  brain]
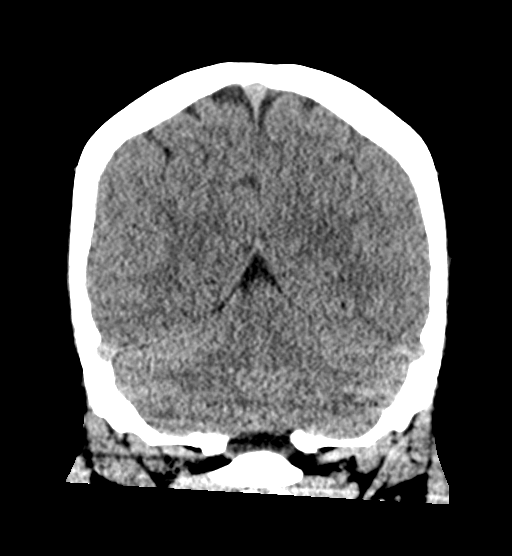
[im 33/73  brain]
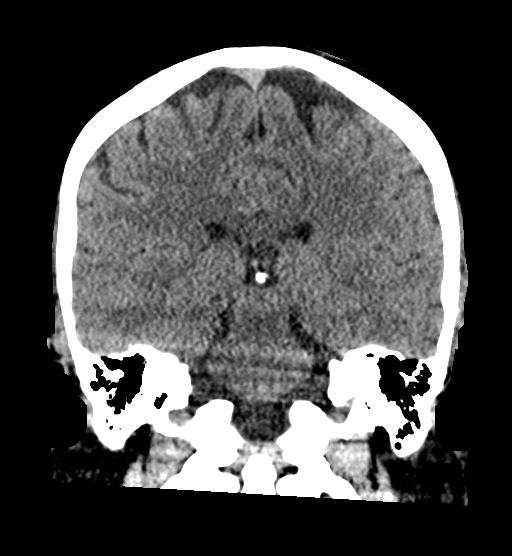
[im 41/73  brain]
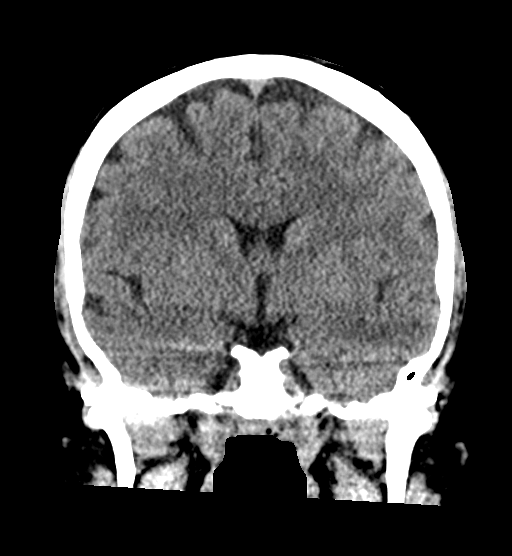

[Series 5: sagittal soft tissue · sagittal · 0.37mm/px · 3 of 56 slices shown]
[im 19/56  brain]
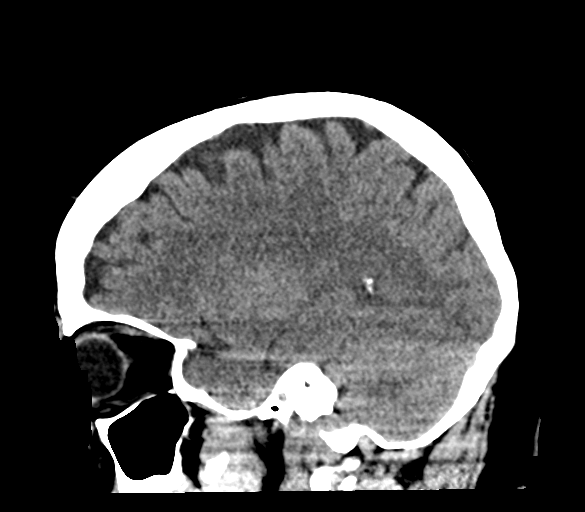
[im 28/56  brain]
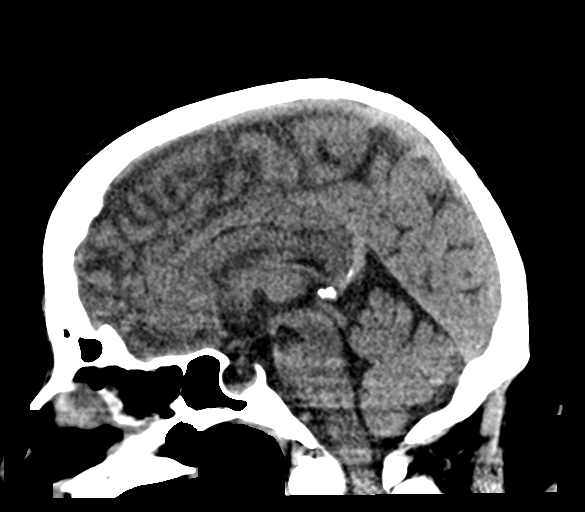
[im 37/56  brain]
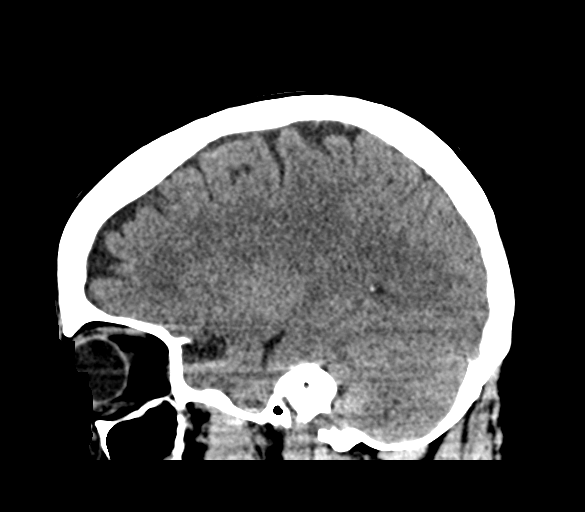

[16 of 47 positions shown; findings below may reference images not displayed]

FINDINGS: CT HEAD FINDINGS

Brain: Normal anatomic configuration. No abnormal intra or
extra-axial mass lesion or fluid collection. No abnormal mass effect
or midline shift. No evidence of acute intracranial hemorrhage or
infarct. Ventricular size is normal. Cerebellum unremarkable.

Vascular: Unremarkable

Skull: Intact

Sinuses/Orbits: Moderate mucosal thickening within the left
maxillary sinus without air-fluid level. Remaining paranasal sinuses
are clear. Nasal septum appears eroded. Orbits are unremarkable.

Other: Mastoid air cells and middle ear cavities are clear.

CT CERVICAL SPINE FINDINGS

Alignment: Normal.

Skull base and vertebrae: No acute fracture. No primary bone lesion
or focal pathologic process.

Soft tissues and spinal canal: No prevertebral fluid or swelling. No
visible canal hematoma.

Disc levels: Intervertebral disc heights are preserved. Prevertebral
soft tissues are not thickened. Spinal canal is widely patent. No
significant neuroforaminal narrowing.

Upper chest: Negative.

Other: None
IMPRESSION: 1. No acute intracranial abnormality. No calvarial fracture.
2. Moderate left maxillary sinus disease.
3. No acute fracture or listhesis of the cervical spine.  None

## 2022-09-28 IMAGING — CT CT CERVICAL SPINE W/O CM
3 of 4 series · 12 of 35 positions shown, 14 images · non-contrast
Comparison: None Available.

CLINICAL DATA: Mental status change, unknown cause; Neck trauma,
intoxicated or obtunded (Age >= 16y)



[Series 4: sagittal bone · sagittal · 0.36mm/px · 5 of 97 slices shown, 6 images]
[im 33/97  bone]
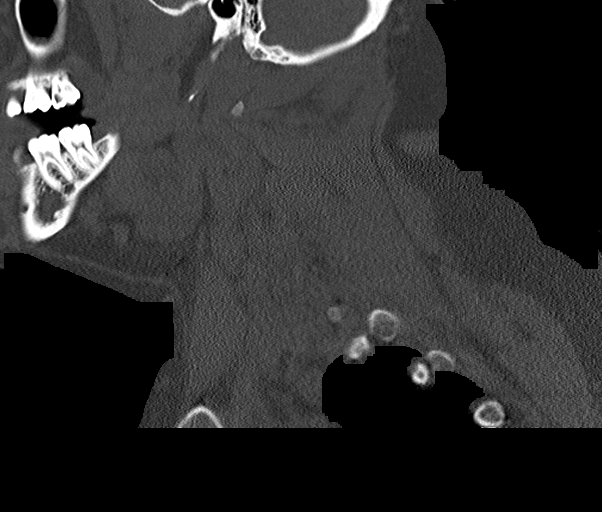
[im 41/97  bone]
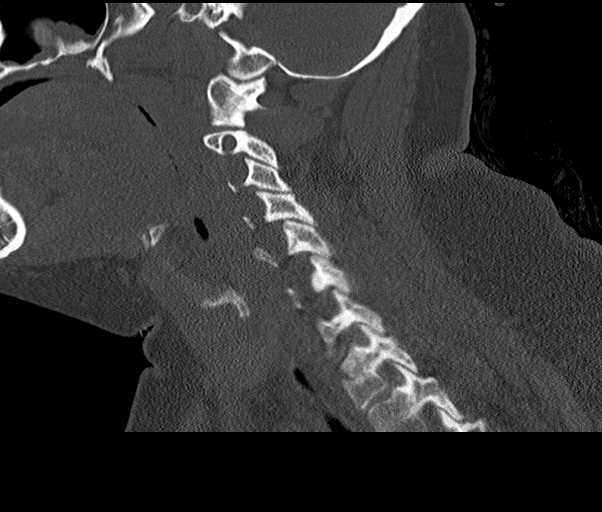
[im 49/97  soft-tissue]
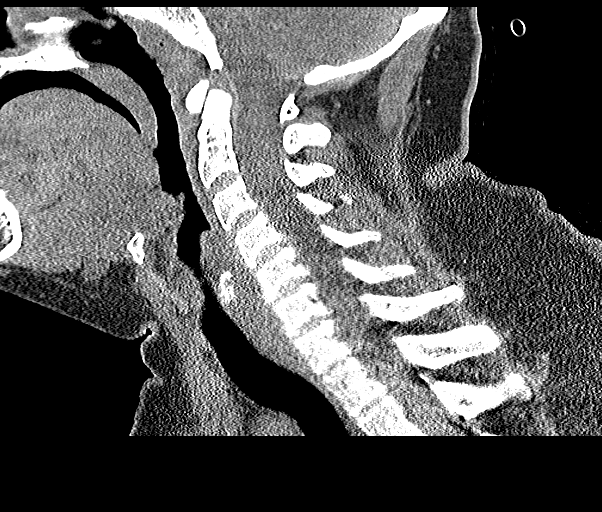
[im 49/97  bone]
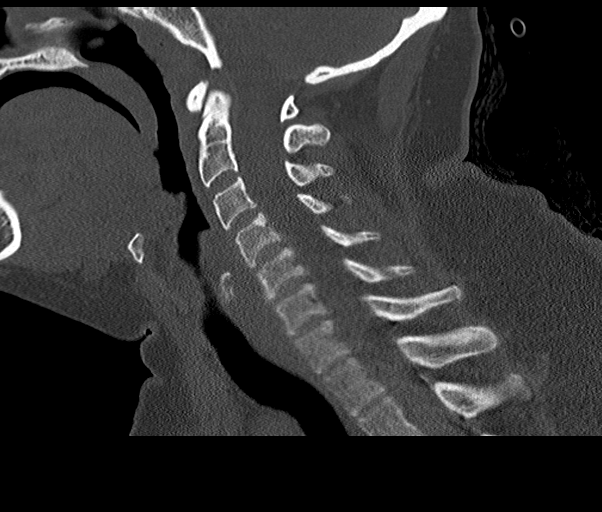
[im 57/97  bone]
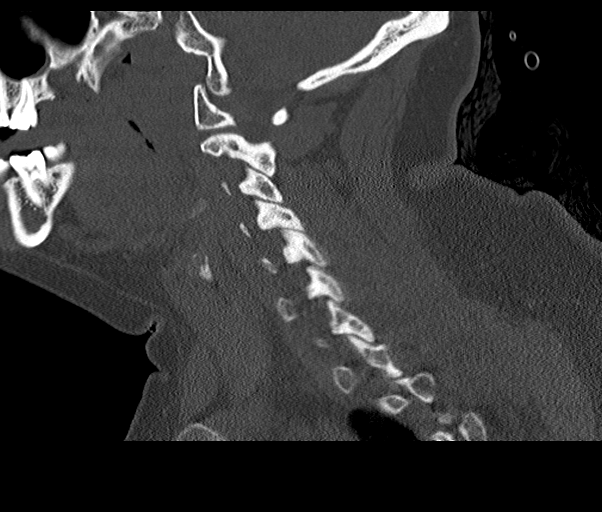
[im 65/97  bone]
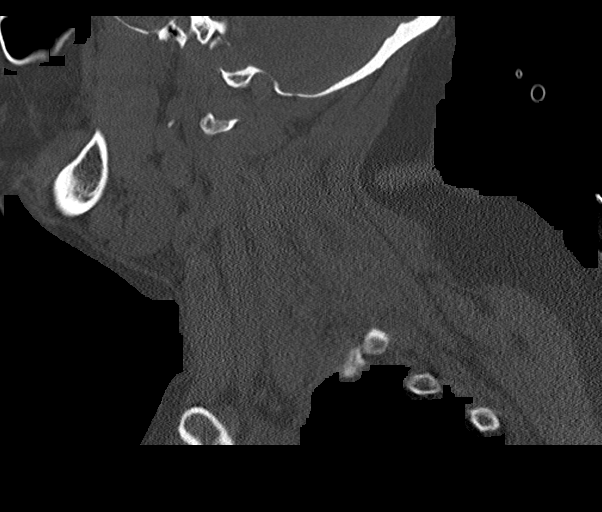

[Series 5: coronal bone · coronal · 0.41mm/px · 3 of 108 slices shown]
[im 38/108  bone]
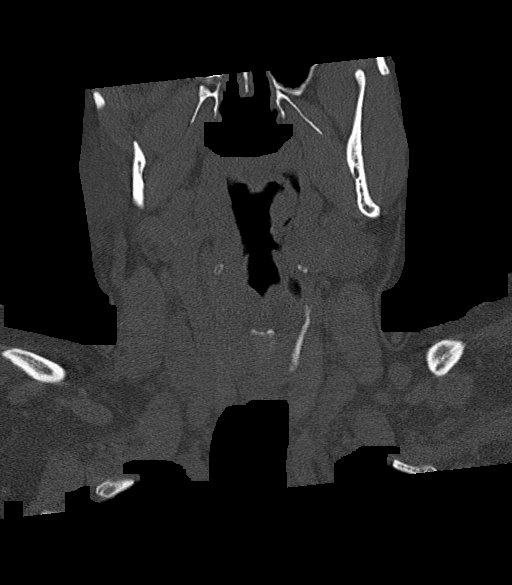
[im 49/108  bone]
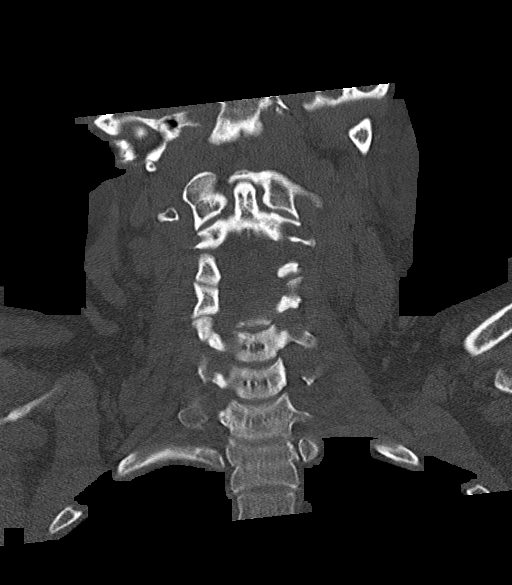
[im 60/108  bone]
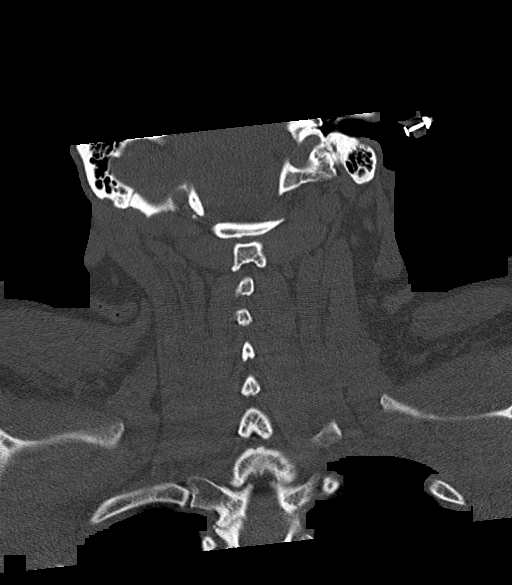

[Series 6: orthogonal bone · axial · 0.30mm/px · z∈[-286,-125]mm · 4 of 132 slices shown, 5 images]
[im 17/132  soft-tissue]
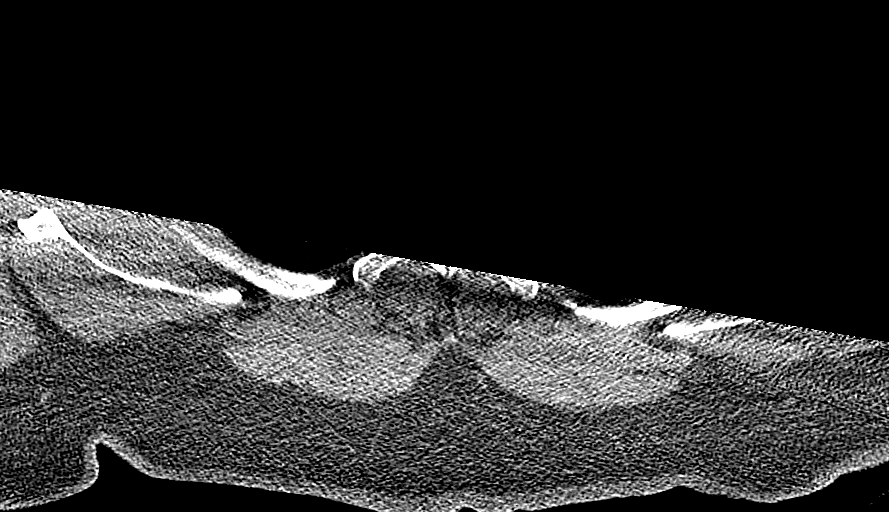
[im 17/132  bone]
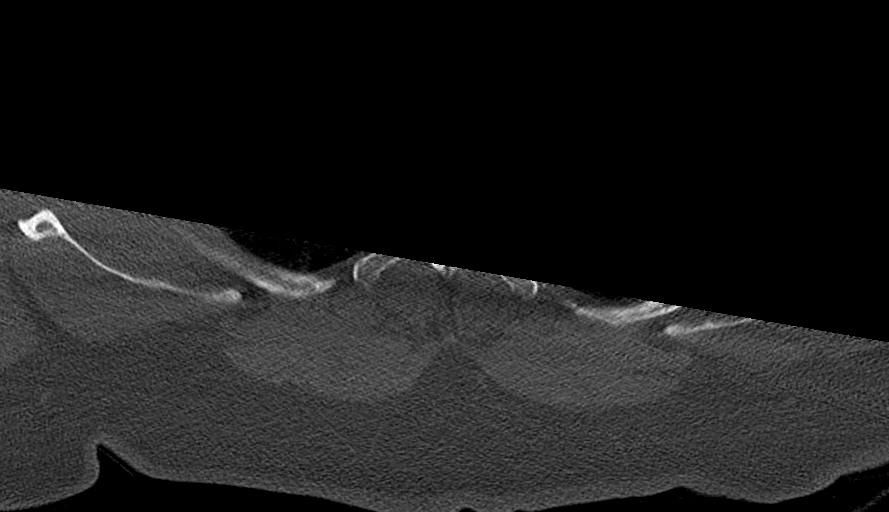
[im 50/132  bone]
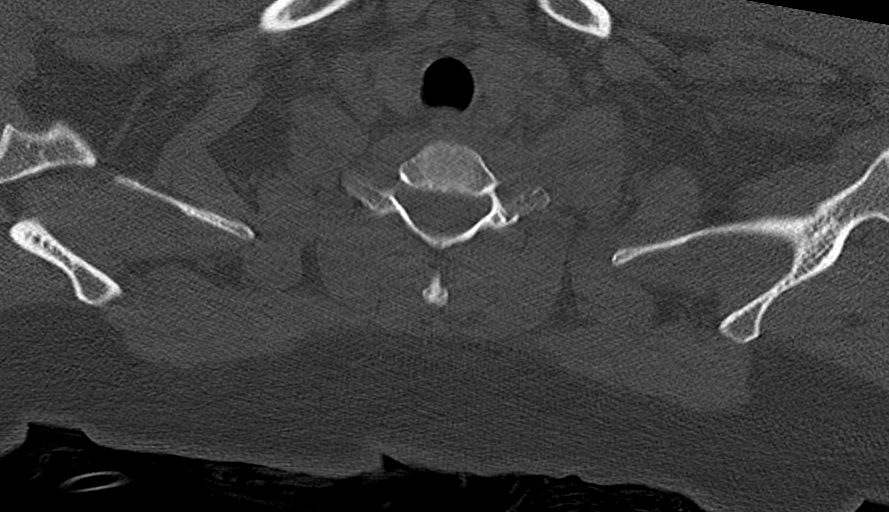
[im 82/132  bone]
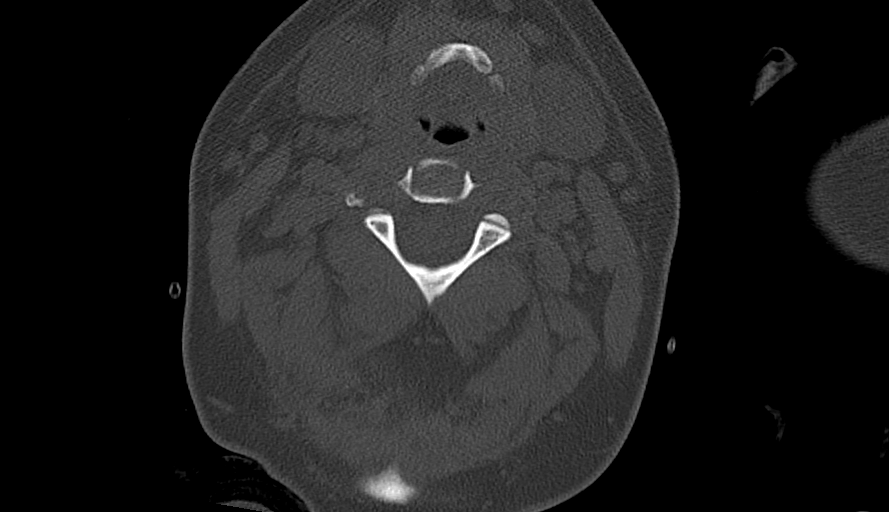
[im 115/132  bone]
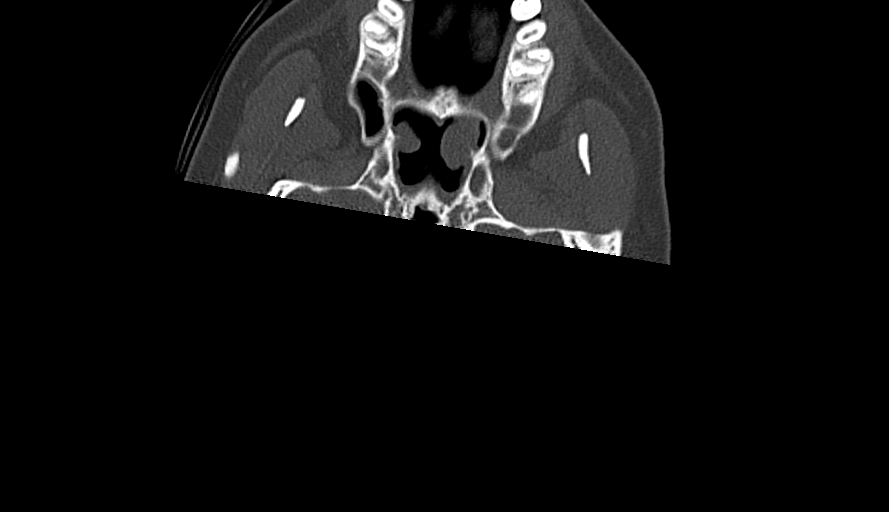

[12 of 35 positions shown; findings below may reference images not displayed]

FINDINGS: CT HEAD FINDINGS

Brain: Normal anatomic configuration. No abnormal intra or
extra-axial mass lesion or fluid collection. No abnormal mass effect
or midline shift. No evidence of acute intracranial hemorrhage or
infarct. Ventricular size is normal. Cerebellum unremarkable.

Vascular: Unremarkable

Skull: Intact

Sinuses/Orbits: Moderate mucosal thickening within the left
maxillary sinus without air-fluid level. Remaining paranasal sinuses
are clear. Nasal septum appears eroded. Orbits are unremarkable.

Other: Mastoid air cells and middle ear cavities are clear.

CT CERVICAL SPINE FINDINGS

Alignment: Normal.

Skull base and vertebrae: No acute fracture. No primary bone lesion
or focal pathologic process.

Soft tissues and spinal canal: No prevertebral fluid or swelling. No
visible canal hematoma.

Disc levels: Intervertebral disc heights are preserved. Prevertebral
soft tissues are not thickened. Spinal canal is widely patent. No
significant neuroforaminal narrowing.

Upper chest: Negative.

Other: None
IMPRESSION: 1. No acute intracranial abnormality. No calvarial fracture.
2. Moderate left maxillary sinus disease.
3. No acute fracture or listhesis of the cervical spine.  None

## 2023-07-03 ENCOUNTER — Ambulatory Visit: Payer: Medicaid Other | Admitting: Gastroenterology

## 2023-12-13 ENCOUNTER — Telehealth: Payer: Self-pay

## 2023-12-13 NOTE — Telephone Encounter (Signed)
 The patient called in and left a voicemail wanting to schedule appointment she wants a second opinion on her CT scan. I called her letting her know we received her message, and I informed her that she will need another referral because he referral is no good. She had appointment with Dr. Unk that she canceled.
